# Patient Record
Sex: Female | Born: 1990 | Race: Black or African American | Hispanic: No | Marital: Single | Smoking: Never smoker
Health system: Southern US, Community
[De-identification: ages and names within clinical notes are randomized; demographics above are authoritative.]

## PROBLEM LIST (undated history)

## (undated) HISTORY — PX: NECK SURGERY: SHX720

---
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
## 2016-06-04 ENCOUNTER — Other Ambulatory Visit: Payer: Self-pay | Admitting: Physician Assistant

## 2016-06-04 DIAGNOSIS — R1030 Lower abdominal pain, unspecified: Secondary | ICD-10-CM

## 2016-06-04 DIAGNOSIS — K5900 Constipation, unspecified: Secondary | ICD-10-CM

## 2016-06-04 DIAGNOSIS — N941 Unspecified dyspareunia: Secondary | ICD-10-CM

## 2016-06-07 ENCOUNTER — Other Ambulatory Visit: Payer: Self-pay

## 2016-06-14 ENCOUNTER — Other Ambulatory Visit: Payer: Self-pay

## 2017-09-07 ENCOUNTER — Ambulatory Visit (HOSPITAL_COMMUNITY)
Admission: EM | Admit: 2017-09-07 | Discharge: 2017-09-07 | Disposition: A | Discharge status: Home / Self Care | Payer: No Typology Code available for payment source | Attending: Family Medicine | Admitting: Family Medicine

## 2017-09-07 ENCOUNTER — Encounter (HOSPITAL_COMMUNITY): Payer: Self-pay | Admitting: Emergency Medicine

## 2017-09-07 DIAGNOSIS — J029 Acute pharyngitis, unspecified: Secondary | ICD-10-CM | POA: Diagnosis present

## 2017-09-07 DIAGNOSIS — Z885 Allergy status to narcotic agent status: Secondary | ICD-10-CM | POA: Insufficient documentation

## 2017-09-07 DIAGNOSIS — R1310 Dysphagia, unspecified: Secondary | ICD-10-CM | POA: Diagnosis not present

## 2017-09-07 DIAGNOSIS — J039 Acute tonsillitis, unspecified: Secondary | ICD-10-CM | POA: Diagnosis not present

## 2017-09-07 DIAGNOSIS — Z79899 Other long term (current) drug therapy: Secondary | ICD-10-CM | POA: Diagnosis not present

## 2017-09-07 DIAGNOSIS — R590 Localized enlarged lymph nodes: Secondary | ICD-10-CM | POA: Diagnosis not present

## 2017-09-07 DIAGNOSIS — J358 Other chronic diseases of tonsils and adenoids: Secondary | ICD-10-CM

## 2017-09-07 LAB — POCT RAPID STREP A: Streptococcus, Group A Screen (Direct): NEGATIVE

## 2017-09-07 MED ORDER — METHYLPREDNISOLONE ACETATE 80 MG/ML IJ SUSP
INTRAMUSCULAR | Status: AC
  Filled 2017-09-07: qty 1

## 2017-09-07 MED ORDER — METHYLPREDNISOLONE ACETATE 80 MG/ML IJ SUSP
80.0000 mg | Freq: Once | INTRAMUSCULAR | Status: AC
  Administered 2017-09-07: 80 mg via INTRAMUSCULAR

## 2017-09-07 MED ORDER — AMOXICILLIN 400 MG/5ML PO SUSR: 800.0000 mg | Freq: Two times a day (BID) | ORAL | 0 refills | Status: DC

## 2017-09-07 NOTE — ED Triage Notes (Signed)
Pt c/o sore throat, fever, chills since friday

## 2017-09-07 NOTE — ED Provider Notes (Signed)
  MRN: [id number] DOB: 05-07-1990  Subjective:   Michele [name] is a 27 y.o. female presenting for 2 day history of sore throat, difficulty swallowing, body aches, headaches, chills, fever (101F), sinus congestion, bilateral ear discomfort, nausea without vomiting. Has tried Alleve, alka-seltzer with minimal relief. Took 3 Alleve at once felt upset stomach after that. Denies sinus pain, cough, chest pain, shob, wheezing, abdominal pain, rashes. Denies smoking cigarettes. Takes Allegra for her allergies. Also take Effexor, just started this ~3 weeks ago.  Reports a history of tonsillitis, last episode several years ago.  No current facility-administered medications for this encounter.   Current Outpatient Medications:  .  Fexofenadine HCl (ALLEGRA PO), Take by mouth., Disp: , Rfl:  .  venlafaxine (EFFEXOR) 75 MG tablet, Take 75 mg by mouth 2 (two) times daily., Disp: , Rfl:     Allergies  Allergen Reactions  . Codeine     PMH of allergies, depression.     Past Surgical History:  Procedure Laterality Date  . NECK SURGERY      Objective:   Vitals: BP 138/76   Pulse (!) 104   Temp 98.3 F (36.8 C)   Resp 18   LMP 08/07/2017   SpO2 100%   Physical Exam  Constitutional: She is oriented to person, place, and time. She appears well-developed and well-nourished.  HENT:  Right-sided tonsillar swelling, erythema with significant exudate.  Eyes: Right eye exhibits no discharge. Left eye exhibits no discharge. No scleral icterus.  Neck: Normal range of motion. Neck supple.  Cardiovascular: Normal rate, regular rhythm and intact distal pulses. Exam reveals no gallop and no friction rub.  No murmur heard. Pulmonary/Chest: Effort normal and breath sounds normal. No stridor. No respiratory distress. She has no wheezes. She has no rales.  Lymphadenopathy:    She has cervical adenopathy (right sided).  Neurological: She is alert and oriented to person, place, and time.  Skin: Skin is warm  and dry.  Psychiatric: She has a normal mood and affect.   Results for orders placed or performed during the hospital encounter of 09/07/17 (from the past 24 hour(s))  POCT rapid strep A [facility])     Status: None   Collection Time: 09/07/17  5:54 PM  Result Value Ref Range   Streptococcus, Group A Screen (Direct) NEGATIVE NEGATIVE    Assessment and Plan :   Pharyngitis, unspecified etiology  Tonsillar exudate  Tonsillitis  Cervical lymphadenopathy  Despite rapid strep test being negative will cover for pharyngitis given her physical exam findings.  Patient is to start amoxicillin twice a day with food.  Schedule Tylenol and ibuprofen for pain and inflammation.  Follow-up with PCP, consider referral to ENT for frequent throat infections, tonsillitis.   Jaynee Eagles, [place] 09/07/17 1857

## 2017-09-07 NOTE — Discharge Instructions (Signed)
You may take 500mg  Tylenol every 6 hours for pain and inflammation. Hydrate well with at least 2 liters (1 gallon) of water daily. Touch base with your PCP about a referral to ENT if you continue to have problems with throat infections.

## 2017-09-08 ENCOUNTER — Emergency Department (HOSPITAL_COMMUNITY): Payer: No Typology Code available for payment source

## 2017-09-08 ENCOUNTER — Encounter (HOSPITAL_COMMUNITY): Payer: Self-pay

## 2017-09-08 ENCOUNTER — Other Ambulatory Visit: Payer: Self-pay

## 2017-09-08 ENCOUNTER — Emergency Department (HOSPITAL_COMMUNITY)
Admission: EM | Admit: 2017-09-08 | Discharge: 2017-09-08 | Disposition: A | Discharge status: Home / Self Care | Payer: No Typology Code available for payment source | Attending: Emergency Medicine | Admitting: Emergency Medicine

## 2017-09-08 DIAGNOSIS — K2900 Acute gastritis without bleeding: Secondary | ICD-10-CM | POA: Insufficient documentation

## 2017-09-08 DIAGNOSIS — R0981 Nasal congestion: Secondary | ICD-10-CM | POA: Diagnosis not present

## 2017-09-08 DIAGNOSIS — J029 Acute pharyngitis, unspecified: Secondary | ICD-10-CM | POA: Diagnosis not present

## 2017-09-08 DIAGNOSIS — Z79899 Other long term (current) drug therapy: Secondary | ICD-10-CM | POA: Diagnosis not present

## 2017-09-08 DIAGNOSIS — R1013 Epigastric pain: Secondary | ICD-10-CM | POA: Diagnosis present

## 2017-09-08 LAB — CBC
HEMATOCRIT: 42.9 % (ref 36.0–46.0)
Hemoglobin: 14 g/dL (ref 12.0–15.0)
MCH: 28.3 pg (ref 26.0–34.0)
MCHC: 32.6 g/dL (ref 30.0–36.0)
MCV: 86.7 fL (ref 78.0–100.0)
Platelets: 224 10*3/uL (ref 150–400)
RBC: 4.95 MIL/uL (ref 3.87–5.11)
RDW: 12.7 % (ref 11.5–15.5)
WBC: 8 10*3/uL (ref 4.0–10.5)

## 2017-09-08 LAB — COMPREHENSIVE METABOLIC PANEL
ALK PHOS: 53 U/L (ref 38–126)
ALT: 25 U/L (ref 14–54)
ANION GAP: 9 (ref 5–15)
AST: 23 U/L (ref 15–41)
Albumin: 4.3 g/dL (ref 3.5–5.0)
BUN: <5 mg/dL — ABNORMAL LOW (ref 6–20)
CALCIUM: 10.1 mg/dL (ref 8.9–10.3)
CHLORIDE: 103 mmol/L (ref 101–111)
CO2: 26 mmol/L (ref 22–32)
Creatinine, Ser: 0.68 mg/dL (ref 0.44–1.00)
GFR calc non Af Amer: >60 mL/min (ref >60)
Glucose, Bld: 95 mg/dL (ref 65–99)
POTASSIUM: 4.6 mmol/L (ref 3.5–5.1)
SODIUM: 138 mmol/L (ref 135–145)
Total Bilirubin: 0.4 mg/dL (ref 0.3–1.2)
Total Protein: 8.3 g/dL — ABNORMAL HIGH (ref 6.5–8.1)

## 2017-09-08 LAB — URINALYSIS, ROUTINE W REFLEX MICROSCOPIC
Bilirubin Urine: NEGATIVE
Glucose, UA: NEGATIVE mg/dL
HGB URINE DIPSTICK: NEGATIVE
Ketones, ur: NEGATIVE mg/dL
Leukocytes, UA: NEGATIVE
Nitrite: NEGATIVE
Protein, ur: NEGATIVE mg/dL
SPECIFIC GRAVITY, URINE: 1.01 (ref 1.005–1.030)
pH: 6 (ref 5.0–8.0)

## 2017-09-08 LAB — I-STAT BETA HCG BLOOD, ED (MC, WL, AP ONLY)

## 2017-09-08 LAB — LIPASE, BLOOD: LIPASE: 26 U/L (ref 11–51)

## 2017-09-08 MED ORDER — IOHEXOL 300 MG/ML  SOLN
100.0000 mL | Freq: Once | INTRAMUSCULAR | Status: AC | PRN
  Administered 2017-09-08: 100 mL via INTRAVENOUS

## 2017-09-08 MED ORDER — RANITIDINE HCL 150 MG PO CAPS: 150.0000 mg | ORAL_CAPSULE | Freq: Two times a day (BID) | ORAL | 0 refills | Status: DC

## 2017-09-08 NOTE — ED Provider Notes (Signed)
[facility] EMERGENCY DEPARTMENT Provider Note   CSN: [id number] Arrival date & time: 09/08/17  1127   History   Chief Complaint Chief Complaint  Patient presents with  . Abdominal Pain    HPI Michele [name] is a 27 y.o. female.  The history is provided by the patient.   27 yo F with no significant PMHx who presents with epigastric abd pain x 1 day. Initially intermittent, now constant. Described as sharp, waxing and waning, severe, nonradiating. Worsened with cough and with GI cocktail given at urgent care prior to arrival. Not relieved by anything. Has had recent URI symptoms, sore throat x 4 days, has been started on amoxicillin. States abd pain occurred shortly after taking alka-seltzer cold and flu as well as ibuprofen. Has IUD. No similar symptoms in the past. No heavy EtOH use. Denies N/V/D.    History reviewed. No pertinent past medical history.  There are no active problems to display for this patient.   Past Surgical History:  Procedure Laterality Date  . NECK SURGERY       OB History   None      Home Medications    Prior to Admission medications   Medication Sig Start Date End Date Taking? Authorizing Provider  amoxicillin (AMOXIL) 400 MG/5ML suspension Take 10 mLs (800 mg total) by mouth 2 (two) times daily. 09/07/17   Jaynee Eagles, PA-C  Fexofenadine HCl (ALLEGRA PO) Take by mouth.    [provider]  ranitidine (ZANTAC) 150 MG capsule Take 1 capsule (150 mg total) by mouth 2 (two) times daily for 7 days. 09/08/17 09/15/17  Norm Salt, MD  venlafaxine (EFFEXOR) 75 MG tablet Take 75 mg by mouth 2 (two) times daily.    [provider]    Family History Family History  Problem Relation Age of Onset  . Hypertension Mother   . Hypertension Father     Social History Social History   Tobacco Use  . Smoking status: Never Smoker  Substance Use Topics  . Alcohol use: Never    Frequency: Never  . Drug use: Not on file      Allergies   Codeine   Review of Systems Review of Systems  Constitutional: Negative for chills and fever.  HENT: Positive for congestion and sore throat. Negative for ear pain.   Eyes: Negative for pain and visual disturbance.  Respiratory: Negative for cough and shortness of breath.   Cardiovascular: Negative for chest pain and palpitations.  Gastrointestinal: Positive for abdominal pain. Negative for diarrhea, nausea and vomiting.  Genitourinary: Negative for dysuria and hematuria.  Musculoskeletal: Negative for arthralgias and back pain.  Skin: Negative for color change and rash.  Neurological: Negative for seizures and syncope.  All other systems reviewed and are negative.    Physical Exam Updated Vital Signs BP 135/86   Pulse 73   Temp 98.2 F (36.8 C) (Oral)   Resp 18   Ht 5\' 1"  (1.549 m)   Wt 72.6 kg (160 lb)   LMP 08/20/2017   SpO2 99%   BMI 30.23 kg/m   Physical Exam  Constitutional: She appears well-developed and well-nourished. No distress.  HENT:  Head: Normocephalic and atraumatic.  Mouth/Throat: Oropharynx is clear and moist.  Eyes: Conjunctivae are normal. No scleral icterus.  Neck: Neck supple.  Cardiovascular: Normal rate and regular rhythm.  No murmur heard. Pulmonary/Chest: Effort normal and breath sounds normal. No respiratory distress.  Abdominal: Soft. There is tenderness in the epigastric area.  There is negative Murphy's sign.  Musculoskeletal: She exhibits no edema.  Neurological: She is alert.  Skin: Skin is warm and dry.  Psychiatric: She has a normal mood and affect.  Nursing note and vitals reviewed.    ED Treatments / Results  Labs (all labs ordered are listed, but only abnormal results are displayed) Labs Reviewed  COMPREHENSIVE METABOLIC PANEL - Abnormal; Notable for the following components:      Result Value   BUN <5 (*)    Total Protein 8.3 (*)    All other components within normal limits  LIPASE, BLOOD  CBC   URINALYSIS, ROUTINE W REFLEX MICROSCOPIC  I-STAT BETA HCG BLOOD, ED ([facility], [facility], [facility] ONLY)    EKG EKG Interpretation  Date/Time:  Monday September 08 2017 17:53:55 EDT Ventricular Rate:  77 PR Interval:    QRS Duration: 64 QT Interval:  359 QTC Calculation: 407 R Axis:   79 Text Interpretation:  Sinus rhythm Abnormal Q suggests anterior infarct No previous ECGs available Confirmed by Theotis Burrow [id number]) on 09/08/2017 6:41:25 PM   Radiology Ct Abdomen Pelvis W Contrast  Result Date: 09/08/2017 CLINICAL DATA:  Epigastric pain.  Worsening pain since 2 days prior EXAM: CT ABDOMEN AND PELVIS WITH CONTRAST TECHNIQUE: Multidetector CT imaging of the abdomen and pelvis was performed using the standard protocol following bolus administration of intravenous contrast. CONTRAST:  126mL OMNIPAQUE IOHEXOL 300 MG/ML  SOLN COMPARISON:  None. FINDINGS: Lower chest: Lung bases are clear. Hepatobiliary: No focal hepatic lesion. No biliary duct dilatation. Gallbladder is normal. Common bile duct is normal. Pancreas: Pancreas is normal. No ductal dilatation. No pancreatic inflammation. Spleen: Normal spleen Adrenals/urinary tract: Adrenal glands and kidneys are normal. The ureters and bladder normal. Stomach/Bowel: Distal esophagus is normal. The proximal stomach is normal. There is submucosal edema within the gastric antrum lesion pylorus with wall thickening to 13 mm (image 24/2. Duodenum is normal. Small-bowel normal. Appendix normal ascending and transverse colon are normal. The descending colon sigmoid colon normal. Rectum normal Vascular/Lymphatic: Abdominal aorta is normal caliber. No periportal or retroperitoneal adenopathy. No pelvic adenopathy. Reproductive: IUD in expected location.  Uterus and ovaries normal Other: No free fluid. Musculoskeletal: No aggressive osseous lesion. IMPRESSION: 1. Sub mucosal edema in the gastric antrum and pylorus suggests gastritis. No evidence of gastric outlet obstruction. 2. Normal  pancreas and gallbladder. 3. IUD in expected location. Electronically Signed   By: Suzy Bouchard M.D.   On: 09/08/2017 17:54    Procedures Procedures (including critical care time)  Medications Ordered in ED Medications  iohexol (OMNIPAQUE) 300 MG/ML solution 100 mL (100 mLs Intravenous Contrast Given 09/08/17 1734)     Initial Impression / Assessment and Plan / ED Course  I have reviewed the triage vital signs and the nursing notes.  Pertinent labs & imaging results that were available during my care of the patient were reviewed by me and considered in my medical decision making (see chart for details).     Michele [name] is a 27 y.o. female with no significant PMhx who p/w epigastric abd pain in setting or sore throat, URI symptoms. Reviewed and confirmed nursing documentation for past medical history, family history, social history. VS afebrile, wnl. Exam remarkable for epigastric tenderness. Ddx includes PUD/ gastritis related to pharyngitis illness, though did not improve with GI cocktail. Considering duodenitis, ulcer, pancreatitis though less likely.  .  Beta hgc neg. Lipase wnl. CMP unremarkable. CBC unremarkable. UA wnl. CT abd/pelvis with findings c/w gastritis. EKG at  17:53 with NSR, nl axis, nl intervals, no STE/D.   Old records reviewed. Labs reviewed by me and used in the medical decision making.  Imaging viewed and interpreted by me and used in the medical decision making (formal interpretation from radiologist). EKG reviewed by me and used in the medical decision making. D/c home in stable condition with zantac, return precautions discussed. Patient agreeable with plan for d/c home.   Final Clinical Impressions(s) / ED Diagnoses   Final diagnoses:  Acute gastritis without hemorrhage, unspecified gastritis type    ED Discharge Orders        Ordered    ranitidine (ZANTAC) 150 MG capsule  2 times daily     09/08/17 1844       Norm Salt, MD 09/08/17 1847      Rex Kras, Wenda Overland, MD 09/10/17 1454

## 2017-09-08 NOTE — ED Triage Notes (Addendum)
Pt presents for evaluation of ongoing sore throat with blisters to throat, multiple negative strep tests. Pt was given solumedrol injection yesterday and PO amoxicillin. Pt was seen by PCP today for abd pain as well. Denies N/V/D.

## 2017-09-09 LAB — CULTURE, GROUP A STREP (THRC)

## 2018-04-24 ENCOUNTER — Ambulatory Visit (INDEPENDENT_AMBULATORY_CARE_PROVIDER_SITE_OTHER): Payer: Medicaid Other | Admitting: Family Medicine

## 2018-04-24 ENCOUNTER — Other Ambulatory Visit (HOSPITAL_COMMUNITY)
Admission: RE | Admit: 2018-04-24 | Discharge: 2018-04-24 | Disposition: A | Discharge status: Home / Self Care | Payer: Medicaid Other | Source: Ambulatory Visit | Attending: Family Medicine | Admitting: Family Medicine

## 2018-04-24 ENCOUNTER — Encounter: Payer: Self-pay | Admitting: Family Medicine

## 2018-04-24 VITALS — BP 116/70 | HR 65 | Temp 97.8°F | Resp 16 | Ht 62.0 in | Wt 165.6 lb

## 2018-04-24 DIAGNOSIS — Z124 Encounter for screening for malignant neoplasm of cervix: Secondary | ICD-10-CM | POA: Insufficient documentation

## 2018-04-24 DIAGNOSIS — M5416 Radiculopathy, lumbar region: Secondary | ICD-10-CM | POA: Diagnosis not present

## 2018-04-24 DIAGNOSIS — Z13220 Encounter for screening for lipoid disorders: Secondary | ICD-10-CM

## 2018-04-24 DIAGNOSIS — R413 Other amnesia: Secondary | ICD-10-CM

## 2018-04-24 DIAGNOSIS — N941 Unspecified dyspareunia: Secondary | ICD-10-CM

## 2018-04-24 DIAGNOSIS — R102 Pelvic and perineal pain: Secondary | ICD-10-CM | POA: Diagnosis not present

## 2018-04-24 DIAGNOSIS — Z23 Encounter for immunization: Secondary | ICD-10-CM

## 2018-04-24 DIAGNOSIS — Z131 Encounter for screening for diabetes mellitus: Secondary | ICD-10-CM

## 2018-04-24 DIAGNOSIS — Z113 Encounter for screening for infections with a predominantly sexual mode of transmission: Secondary | ICD-10-CM | POA: Diagnosis present

## 2018-04-24 DIAGNOSIS — Z6830 Body mass index (BMI) 30.0-30.9, adult: Secondary | ICD-10-CM

## 2018-04-24 DIAGNOSIS — Z1159 Encounter for screening for other viral diseases: Secondary | ICD-10-CM

## 2018-04-24 DIAGNOSIS — E6609 Other obesity due to excess calories: Secondary | ICD-10-CM

## 2018-04-24 MED ORDER — PREDNISONE 5 MG (48) PO TBPK: ORAL_TABLET | ORAL | 0 refills | Status: DC
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
## 2018-04-24 NOTE — Patient Instructions (Addendum)
[email or web address] therapist finder. Please try to find out what type of dementia   Here are some resources to help you if you feel you are in a mental health crisis:  [place] - Call [phone]  for help - Website with more resources: GripTrip.com.pt  [facility] - Call [phone] for help. - Mobile Crisis Program available 24 hours a day, 365 days a year. - Available for anyone of any age in [place] counties.  [facility] - Address: [address], [place] - Telephone: [phone]  - Hours of Operation: Sunday - Saturday - 8:00 a.m. - 8:00 p.m. - Medicaid, Medicare (Government Issued Only), BCBS, and [place] [facility], [place], Psychiatrists on-site to provide medication management, [place], and Peer Support Care.  [facility] - Call [phone] for help. - Mobile Crisis Program available 24 hours a day, 365 days a year. - Available for anyone of any age in [place]

## 2018-04-26 LAB — RPR: RPR: NONREACTIVE

## 2018-04-26 LAB — CBC WITH DIFFERENTIAL/PLATELET
ABSOLUTE MONOCYTES: 458 {cells}/uL (ref 200–950)
BASOS PCT: 0.7 %
Basophils Absolute: 29 cells/uL (ref 0–200)
EOS ABS: 218 {cells}/uL (ref 15–500)
Eosinophils Relative: 5.2 %
HEMATOCRIT: 42.6 % (ref 35.0–45.0)
HEMOGLOBIN: 13.9 g/dL (ref 11.7–15.5)
Lymphs Abs: 1869 cells/uL (ref 850–3900)
MCH: 27.7 pg (ref 27.0–33.0)
MCHC: 32.6 g/dL (ref 32.0–36.0)
MCV: 85 fL (ref 80.0–100.0)
MPV: 10.3 fL (ref 7.5–12.5)
Monocytes Relative: 10.9 %
NEUTROS ABS: 1625 {cells}/uL (ref 1500–7800)
Neutrophils Relative %: 38.7 %
Platelets: 280 10*3/uL (ref 140–400)
RBC: 5.01 10*6/uL (ref 3.80–5.10)
RDW: 12.8 % (ref 11.0–15.0)
Total Lymphocyte: 44.5 %
WBC: 4.2 10*3/uL (ref 3.8–10.8)

## 2018-04-26 LAB — COMPLETE METABOLIC PANEL WITH GFR
AG RATIO: 1.3 (calc) (ref 1.0–2.5)
ALBUMIN MSPROF: 4.4 g/dL (ref 3.6–5.1)
ALT: 16 U/L (ref 6–29)
AST: 14 U/L (ref 10–30)
Alkaline phosphatase (APISO): 55 U/L (ref 33–115)
BILIRUBIN TOTAL: 0.3 mg/dL (ref 0.2–1.2)
BUN: 13 mg/dL (ref 7–25)
CHLORIDE: 102 mmol/L (ref 98–110)
CO2: 28 mmol/L (ref 20–32)
Calcium: 9.9 mg/dL (ref 8.6–10.2)
Creat: 0.73 mg/dL (ref 0.50–1.10)
GFR, EST AFRICAN AMERICAN: 132 mL/min/{1.73_m2} (ref >=60)
GFR, Est Non African American: 114 mL/min/{1.73_m2} (ref >=60)
GLOBULIN: 3.4 g/dL (ref 1.9–3.7)
Glucose, Bld: 59 mg/dL — ABNORMAL LOW (ref 65–139)
POTASSIUM: 4.1 mmol/L (ref 3.5–5.3)
SODIUM: 138 mmol/L (ref 135–146)
TOTAL PROTEIN: 7.8 g/dL (ref 6.1–8.1)

## 2018-04-26 LAB — LIPID PANEL
CHOLESTEROL: 172 mg/dL (ref <200)
HDL: 33 mg/dL — AB (ref >50)
LDL Cholesterol (Calc): 112 mg/dL (calc) — ABNORMAL HIGH
Non-HDL Cholesterol (Calc): 139 mg/dL (calc) — ABNORMAL HIGH (ref <130)
Total CHOL/HDL Ratio: 5.2 (calc) — ABNORMAL HIGH (ref <5.0)
Triglycerides: 152 mg/dL — ABNORMAL HIGH (ref <150)

## 2018-04-26 LAB — HEPATITIS C ANTIBODY
Hepatitis C Ab: NONREACTIVE
SIGNAL TO CUT-OFF: 0.02 (ref <1.00)

## 2018-04-26 LAB — TSH: TSH: 0.27 mIU/L — ABNORMAL LOW

## 2018-04-26 LAB — HIV ANTIBODY (ROUTINE TESTING W REFLEX): HIV 1&2 Ab, 4th Generation: NONREACTIVE

## 2018-04-27 ENCOUNTER — Telehealth: Payer: Self-pay | Admitting: Emergency Medicine

## 2018-04-27 ENCOUNTER — Other Ambulatory Visit: Payer: Self-pay | Admitting: Family Medicine

## 2018-04-27 DIAGNOSIS — R7989 Other specified abnormal findings of blood chemistry: Secondary | ICD-10-CM

## 2018-04-27 LAB — CERVICOVAGINAL ANCILLARY ONLY
BACTERIAL VAGINITIS: NEGATIVE
CANDIDA VAGINITIS: NEGATIVE
CHLAMYDIA, DNA PROBE: NEGATIVE
Neisseria Gonorrhea: NEGATIVE
TRICH (WINDOWPATH): NEGATIVE

## 2018-04-27 NOTE — Telephone Encounter (Signed)
How long will it take for the Prednisone to start working for her back. Still having back pain that radiates to her legs

## 2018-04-27 NOTE — Telephone Encounter (Signed)
She should give it at least 7 days to start really helping.

## 2018-04-28 LAB — CYTOLOGY - PAP
ADEQUACY: ABSENT
Bacterial vaginitis: POSITIVE — AB
CANDIDA VAGINITIS: NEGATIVE
CHLAMYDIA, DNA PROBE: NEGATIVE
DIAGNOSIS: NEGATIVE
Neisseria Gonorrhea: NEGATIVE
Trichomonas: NEGATIVE

## 2018-04-29 NOTE — Telephone Encounter (Signed)
Left message for patient

## 2018-04-30 ENCOUNTER — Telehealth: Payer: Self-pay | Admitting: *Deleted

## 2018-04-30 DIAGNOSIS — M545 Low back pain, unspecified: Secondary | ICD-10-CM

## 2018-04-30 DIAGNOSIS — M79605 Pain in left leg: Secondary | ICD-10-CM

## 2018-04-30 DIAGNOSIS — M79604 Pain in right leg: Secondary | ICD-10-CM

## 2018-04-30 NOTE — Telephone Encounter (Signed)
Patient has access to MyChart and she is able to see her results- she is concerned about her glucose levels being so low. Patient states she was not fasting when she was in the office- she actually had just eaten and was drinking a Chick fila lemonade. Patient was not having symptoms and has never had any problems in the past. Does she needs to recheck that? She is aware that she needs to repeat her TSH- and has marked her calender for that.  Patient states she is not getting the results she thought she would with the Prednisone- she is still having pain in her leg. She wants to know what the next step is. Told patient I would send her message and get answer for her.

## 2018-05-01 NOTE — Telephone Encounter (Signed)
Still having leg pains. Hurting her to walk. Please advise.

## 2018-05-01 NOTE — Telephone Encounter (Signed)
Never had low BS before. Had just ate before appointment. Had migraines, jittery and headaches at time. Please advise

## 2018-05-04 ENCOUNTER — Ambulatory Visit: Admission: RE | Admit: 2018-05-04 | Payer: Medicaid Other | Source: Ambulatory Visit

## 2018-05-04 NOTE — Telephone Encounter (Addendum)
Blood sugar is likely going low due to over release of insulin after eating large meal/meal high in sugar.  It sounds like this may be an ongoing issue based on her report of hx of feeling jittery and headaches.  Though she is NOT diabetic, she needs to try eating a low glycemic diet like a diabetic for 2-4 weeks and report back about her symptoms.  Avoiding food high in sugar/carbohydrates.  Eating whole grains/wheat foods, no processed sugars, no sweet beverages, and make sure to eat 3 meals and 2-3 healthy snacks each day. Drink plenty of water.   I will refer to ortho for low back and leg pain.

## 2018-05-04 NOTE — Addendum Note (Signed)
Addended by: Hubbard Hartshorn on: 05/04/2018 09:04 AM   Modules accepted: Orders

## 2018-05-04 NOTE — Telephone Encounter (Signed)
Patient notified

## 2018-05-06 ENCOUNTER — Telehealth: Payer: Self-pay | Admitting: Emergency Medicine

## 2018-05-06 NOTE — Telephone Encounter (Signed)
Copied from [place] [id number]. Topic: General - Other >> May 04, 2018  4:13 PM Windy Kalata wrote: Reason for CRM: Denton Ar from [facility] called as a courtesy that patient did not show for her ultrasound  Best call back is [phone]

## 2018-05-08 ENCOUNTER — Ambulatory Visit
Admission: RE | Admit: 2018-05-08 | Discharge: 2018-05-08 | Disposition: A | Discharge status: Home / Self Care | Payer: Medicaid Other | Source: Ambulatory Visit | Attending: Family Medicine | Admitting: Family Medicine

## 2018-05-08 DIAGNOSIS — N941 Unspecified dyspareunia: Secondary | ICD-10-CM | POA: Diagnosis present

## 2018-05-08 DIAGNOSIS — R102 Pelvic and perineal pain: Secondary | ICD-10-CM | POA: Insufficient documentation

## 2018-05-11 ENCOUNTER — Encounter: Payer: Self-pay | Admitting: Family Medicine

## 2018-05-12 ENCOUNTER — Encounter: Payer: Medicaid Other | Admitting: Family Medicine

## 2018-05-19 ENCOUNTER — Encounter: Payer: Self-pay | Admitting: Family Medicine

## 2018-05-19 DIAGNOSIS — M5416 Radiculopathy, lumbar region: Secondary | ICD-10-CM

## 2018-06-08 ENCOUNTER — Telehealth: Payer: Self-pay | Admitting: Family Medicine

## 2018-06-08 ENCOUNTER — Other Ambulatory Visit: Payer: Self-pay

## 2018-06-08 DIAGNOSIS — R7989 Other specified abnormal findings of blood chemistry: Secondary | ICD-10-CM

## 2018-06-08 NOTE — Telephone Encounter (Signed)
-----   Message from Hubbard Hartshorn, FNP sent at 04/27/2018  7:45 AM EST ----- Regarding: Repeat TSH Please call patient to come in for repeat thyroid testing.

## 2018-06-08 NOTE — Telephone Encounter (Signed)
Voicemail box is full. If patient calls back please inform patient to come back in for repeat thyroid testing.

## 2018-07-23 ENCOUNTER — Ambulatory Visit: Payer: Medicaid Other | Admitting: Family Medicine

## 2018-09-25 ENCOUNTER — Encounter: Payer: Medicaid Other | Admitting: Family Medicine

## 2018-10-15 ENCOUNTER — Encounter: Payer: Medicaid Other | Admitting: Family Medicine

## 2018-10-27 ENCOUNTER — Other Ambulatory Visit: Payer: Self-pay

## 2018-10-27 ENCOUNTER — Other Ambulatory Visit (HOSPITAL_COMMUNITY)
Admission: RE | Admit: 2018-10-27 | Discharge: 2018-10-27 | Disposition: A | Discharge status: Home / Self Care | Payer: Medicaid Other | Source: Ambulatory Visit | Attending: Nurse Practitioner | Admitting: Nurse Practitioner

## 2018-10-27 ENCOUNTER — Ambulatory Visit: Payer: Medicaid Other | Admitting: Nurse Practitioner

## 2018-10-27 ENCOUNTER — Encounter: Payer: Self-pay | Admitting: Nurse Practitioner

## 2018-10-27 VITALS — BP 122/78 | HR 85 | Temp 97.3°F | Resp 14 | Ht 62.0 in | Wt 177.7 lb

## 2018-10-27 DIAGNOSIS — R350 Frequency of micturition: Secondary | ICD-10-CM | POA: Diagnosis not present

## 2018-10-27 DIAGNOSIS — N898 Other specified noninflammatory disorders of vagina: Secondary | ICD-10-CM | POA: Diagnosis not present

## 2018-10-27 DIAGNOSIS — R102 Pelvic and perineal pain: Secondary | ICD-10-CM | POA: Insufficient documentation

## 2018-10-27 LAB — POCT URINALYSIS DIPSTICK
Bilirubin, UA: NEGATIVE
Blood, UA: NEGATIVE
Glucose, UA: NEGATIVE
Ketones, UA: NEGATIVE
Leukocytes, UA: NEGATIVE
Nitrite, UA: NEGATIVE
Protein, UA: NEGATIVE
Spec Grav, UA: 1.015 (ref 1.010–1.025)
Urobilinogen, UA: 0.2 E.U./dL
pH, UA: 5.5 (ref 5.0–8.0)

## 2018-10-27 NOTE — Progress Notes (Signed)
Name: Michele [name]   MRN: [id number]    DOB: 01-29-1991   Date:10/27/2018       Progress Note  Subjective  Chief Complaint  Chief Complaint  Patient presents with  . Follow-up    Internal vaginal bump    HPI  Patient presents for vaginal pain, states this has been ongoing for 2 years. Was seen for this in January- vaginal exam was normal except for tenderness of the right ovary. Pelvic/transvaginal ultrasound completed in 05/08/18 was normal- showing no acute findings- Right ovary Measurements: 3.0 x 1.7 x 2.2 cm = volume: 5.7 mL. Normal appearance/no adnexal mass. IUD noted in expected position- was placed in 2016.  States area is painful and feels like there is a golfball sized area inside vagina, states had sex 2 weeks ago and hit that area and it was very tender and has been painful since then. Has not taken anything for pain, unable to ride bike due to discomfort.  Does endorses recent urinary frequency and states she is peeing small amounts. Denies dysuria or lower back pain, endorses suprapubic pressure.   PHQ2/9: Depression screen [facility] 2/9 10/27/2018 04/24/2018  Decreased Interest 0 0  Down, Depressed, Hopeless 0 0  PHQ - 2 Score 0 0  Altered sleeping 0 0  Tired, decreased energy 0 0  Change in appetite 0 1  Feeling bad or failure about yourself  0 -  Trouble concentrating 0 3  Moving slowly or fidgety/restless 0 0  Suicidal thoughts 0 0  PHQ-9 Score 0 4  Difficult doing work/chores Not difficult at all Somewhat difficult     PHQ reviewed. Negative  Patient Active Problem List   Diagnosis Date Noted  . Memory changes 04/24/2018  . Left lumbar radiculitis 04/24/2018  . Class 1 obesity due to excess calories without serious comorbidity with body mass index (BMI) of 30.0 to 30.9 in adult 04/24/2018    History reviewed. No pertinent past medical history.  Past Surgical History:  Procedure Laterality Date  . NECK SURGERY      Social History   Tobacco Use  .  Smoking status: Never Smoker  . Smokeless tobacco: Never Used  Substance Use Topics  . Alcohol use: Never    Frequency: Never     Current Outpatient Medications:  .  Fexofenadine HCl (ALLEGRA PO), Take by mouth., Disp: , Rfl:  .  predniSONE (STERAPRED UNI-PAK 48 TAB) 5 MG (48) TBPK tablet, Take as directed. (Patient not taking: Reported on 10/27/2018), Disp: 48 tablet, Rfl: 0 .  ranitidine (ZANTAC) 150 MG capsule, Take 1 capsule (150 mg total) by mouth 2 (two) times daily for 7 days., Disp: 14 capsule, Rfl: 0 .  venlafaxine (EFFEXOR) 75 MG tablet, Take 75 mg by mouth 2 (two) times daily., Disp: , Rfl:   Allergies  Allergen Reactions  . Codeine     ROS   No other specific complaints in a complete review of systems (except as listed in HPI above).  Objective  Vitals:   10/27/18 1413  BP: 122/78  Pulse: 85  Resp: 14  Temp: (!) 97.3 F (36.3 C)  TempSrc: Temporal  SpO2: 97%  Weight: 177 lb 11.2 oz (80.6 kg)  Height: 5\' 2"  (1.575 m)     Body mass index is 32.5 kg/m.  Nursing Note and Vital Signs reviewed.  Physical Exam Exam conducted with a chaperone present.  Constitutional:      Appearance: She is well-developed. She is not diaphoretic.  HENT:  Head: Normocephalic and atraumatic.  Cardiovascular:     Rate and Rhythm: Normal rate and regular rhythm.     Heart sounds: Normal heart sounds.  Pulmonary:     Effort: Pulmonary effort is normal.     Breath sounds: Normal breath sounds.  Genitourinary:    General: Normal vulva.     Pubic Area: No rash.      Labia:        Right: No rash or tenderness.        Left: No rash or tenderness.      Vagina: Vaginal discharge (white thin discharge) and tenderness present.     Cervix: Discharge present. No erythema or cervical bleeding.     Comments: IUD strings visible  Skin:    General: Skin is warm and dry.     Findings: No erythema.  Neurological:     Mental Status: She is alert and oriented to person, place, and  time.     Coordination: Coordination normal.  Psychiatric:        Behavior: Behavior normal.        Thought Content: Thought content normal.        Judgment: Judgment normal.        No results found for this or any previous visit (from the past 48 hour(s)).  Assessment & Plan  1. Vaginal pain Ibuprofen, negative [place] from January, follow-up with gyn  - Ambulatory referral to Gynecology  2. Urinary frequency - POCT Urinalysis Dipstick  3. Vaginal discharge - Cervicovaginal ancillary only

## 2018-10-27 NOTE — Patient Instructions (Signed)
-   Take ibuprofen 400mg  every 8-12 hours with food for pain and to decrease inflammation If you have not heard anything from my staff in a month about referral from today, please contact us here to follow-up [phone] or through mychart.

## 2018-10-30 ENCOUNTER — Other Ambulatory Visit: Payer: Self-pay | Admitting: Family Medicine

## 2018-10-30 ENCOUNTER — Encounter: Payer: Self-pay | Admitting: Family Medicine

## 2018-10-30 DIAGNOSIS — B9689 Other specified bacterial agents as the cause of diseases classified elsewhere: Secondary | ICD-10-CM

## 2018-10-30 DIAGNOSIS — N760 Acute vaginitis: Secondary | ICD-10-CM

## 2018-10-30 LAB — CERVICOVAGINAL ANCILLARY ONLY
Bacterial vaginitis: POSITIVE — AB
Candida vaginitis: NEGATIVE
Chlamydia: NEGATIVE
Neisseria Gonorrhea: NEGATIVE
Trichomonas: NEGATIVE

## 2018-10-30 MED ORDER — METRONIDAZOLE 500 MG PO TABS: 500.0000 mg | ORAL_TABLET | Freq: Two times a day (BID) | ORAL | 0 refills | Status: AC

## 2018-11-06 ENCOUNTER — Other Ambulatory Visit: Payer: Self-pay

## 2018-11-06 ENCOUNTER — Ambulatory Visit (INDEPENDENT_AMBULATORY_CARE_PROVIDER_SITE_OTHER): Payer: Medicaid Other | Admitting: Obstetrics and Gynecology

## 2018-11-06 ENCOUNTER — Encounter: Payer: Self-pay | Admitting: Obstetrics and Gynecology

## 2018-11-06 VITALS — BP 129/82 | HR 81 | Ht 61.0 in | Wt 176.5 lb

## 2018-11-06 DIAGNOSIS — N898 Other specified noninflammatory disorders of vagina: Secondary | ICD-10-CM | POA: Diagnosis not present

## 2018-11-06 DIAGNOSIS — N941 Unspecified dyspareunia: Secondary | ICD-10-CM

## 2018-11-06 NOTE — Progress Notes (Signed)
HPI:      [name] is a 28 y.o. No obstetric history on file. who LMP was No LMP recorded. (Menstrual status: IUD).  Subjective:   She presents today complains of a bump inside her vagina that comes and goes.  It is occasionally tender with intercourse and patient will now it is there and then at another time there is some tenderness but no bump.  She has recently been tested for all STDs and was found to have BV and is currently taking Flagyl. She was recently examined by her family physician and no abnormalities could be found. Of significant note patient has an IUD for birth control but has monthly periods.  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    Hx: The following portions of the patient's history were reviewed and updated as appropriate:             She  has no past medical history on file. She does not have any pertinent problems on file. She  has a past surgical history that includes Neck surgery. Her family history includes Hypertension in her father and mother. She  reports that she has never smoked. She has never used smokeless tobacco. She reports that she does not drink alcohol or use drugs. She has a current medication list which includes the following prescription(s): fexofenadine hcl, metronidazole, and venlafaxine. She is allergic to codeine.       Review of Systems:  Review of Systems  Constitutional: Denied constitutional symptoms, night sweats, recent illness, fatigue, fever, insomnia and weight loss.  Eyes: Denied eye symptoms, eye pain, photophobia, vision change and visual disturbance.  Ears/Nose/Throat/Neck: Denied ear, nose, throat or neck symptoms, hearing loss, nasal discharge, sinus congestion and sore throat.  Cardiovascular: Denied cardiovascular symptoms, arrhythmia, chest pain/pressure, edema, exercise intolerance, orthopnea and palpitations.  Respiratory: Denied pulmonary symptoms, asthma, pleuritic pain, productive sputum, cough, dyspnea and wheezing.   Gastrointestinal: Denied, gastro-esophageal reflux, melena, nausea and vomiting.  Genitourinary: See HPI for additional information.  Musculoskeletal: Denied musculoskeletal symptoms, stiffness, swelling, muscle weakness and myalgia.  Dermatologic: Denied dermatology symptoms, rash and scar.  Neurologic: Denied neurology symptoms, dizziness, headache, neck pain and syncope.  Psychiatric: Denied psychiatric symptoms, anxiety and depression.  Endocrine: Denied endocrine symptoms including hot flashes and night sweats.   Meds:   Current Outpatient Medications on File Prior to Visit  Medication Sig Dispense Refill  . Fexofenadine HCl (ALLEGRA PO) Take by mouth.    . metroNIDAZOLE (FLAGYL) 500 MG tablet Take 1 tablet (500 mg total) by mouth 2 (two) times daily for 7 days. 14 tablet 0  . venlafaxine (EFFEXOR) 75 MG tablet Take 75 mg by mouth 2 (two) times daily.     No current facility-administered medications on file prior to visit.     Objective:     Vitals:   11/06/18 1014  BP: 129/82  Pulse: 81              Physical examination   Pelvic:   Vulva: Normal appearance.  No lesions.  Vagina: No lesions or abnormalities noted.  Support: Normal pelvic support.  Urethra No masses tenderness or scarring.  Meatus Normal size without lesions or prolapse.  Cervix: Normal appearance.  No lesions.  IUD strings noted at the cervical loss  Anus: Normal exam.  No lesions.  Perineum: Normal exam.  No lesions.        Bimanual   Uterus: Normal size.  Non-tender.  Mobile.  AV.  Adnexae: No  masses.  Non-tender to palpation.  Cul-de-sac: Negative for abnormality.   No abnormality could be found inside the vagina but the spot in question is the right vaginal wall approximately 2 to 3 cm inside and toward the anterior approximately 10 o'clock position.  Assessment:    No obstetric history on file. Patient Active Problem List   Diagnosis Date Noted  . Memory changes 04/24/2018  . Left lumbar  radiculitis 04/24/2018  . Class 1 obesity due to excess calories without serious comorbidity with body mass index (BMI) of 30.0 to 30.9 in adult 04/24/2018     1. Vaginal cyst   2. 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   Plan:            1.  I discussed Gartner's duct cyst in detail and the patient is reassured to know that this is not a significant medical problem.  I have asked her to present when it is present and I will look at it and be able to investigate it further.  I have explained to her that the only treatment is surgery and she believes that it is not that significant and will continue to live with it unless it becomes much more severe. Orders No orders of the defined types were placed in this encounter.   No orders of the defined types were placed in this encounter.     F/U  Return for Pt to contact us if symptoms worsen. I spent 32 minutes involved in the care of this patient of which greater than 50% was spent discussing IUD, types of IUD, bleeding with IUD, Gartner's duct remnant cyst, literature regarding remnant cyst, management of cysts.  All questions answered.  Finis Bud, M.D. 11/06/2018 12:29 PM

## 2018-11-06 NOTE — Progress Notes (Signed)
Patient comes in today as a new patient. Patient states that she has a knot inside that vagina that is painful.

## 2019-01-18 ENCOUNTER — Encounter: Payer: Self-pay | Admitting: Family Medicine

## 2019-01-18 ENCOUNTER — Other Ambulatory Visit: Payer: Self-pay

## 2019-01-18 ENCOUNTER — Ambulatory Visit: Payer: Medicaid Other | Admitting: Family Medicine

## 2019-01-18 VITALS — BP 116/72 | HR 78 | Temp 97.8°F | Resp 16 | Ht 62.0 in | Wt 181.9 lb

## 2019-01-18 DIAGNOSIS — Z01818 Encounter for other preprocedural examination: Secondary | ICD-10-CM

## 2019-01-18 DIAGNOSIS — I471 Supraventricular tachycardia: Secondary | ICD-10-CM | POA: Insufficient documentation

## 2019-01-18 NOTE — Addendum Note (Signed)
Addended by: Iyanna Drummer G on: 01/18/2019 11:18 AM   Modules accepted: Orders

## 2019-01-18 NOTE — Progress Notes (Signed)
Name: Michele [name]   MRN: [id number]    DOB: 05-26-1990   Date:01/18/2019       Progress Note  Subjective  Chief Complaint  Chief Complaint  Patient presents with  . Procedure    surgical clearance for Lipo 360, [place] Butt Lift    HPI  PT presents for surgical clearance; she is planning to have Liposuction 360 and BBL ([employer]) on November 10 in [place].  She has researched her plastic surgery site in detail, we discussed risks/benefits of have surgery in a destination location - she will be staying there for almost a week to recover.  She brings a list of required labs which we will check today. Her EKG shows sinus rhythm, does have remote history of SVT at age 28.  She feels generally well today.  Her BMI is not quite in range - needs to be 33 or below.  Body mass index is 33.27 kg/m. She will follow up in 2 weeks for weigh-in and will work on losing a few more pounds.    Patient Active Problem List   Diagnosis Date Noted  . Memory changes 04/24/2018  . Left lumbar radiculitis 04/24/2018  . Class 1 obesity due to excess calories without serious comorbidity with body mass index (BMI) of 30.0 to 30.9 in adult 04/24/2018    Past Surgical History:  Procedure Laterality Date  . NECK SURGERY      Family History  Problem Relation Age of Onset  . Hypertension Mother   . Hypertension Father     Social History   Socioeconomic History  . Marital status: Single    Spouse name: Not on file  . Number of children: 2  . Years of education: Not on file  . Highest education level: Not on file  Occupational History  . Occupation: Sports administrator    Comment: [employer]  . 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 Occupation: [employer]  Social Needs  . Financial resource strain: Not on file  . Food insecurity    Worry: Never true    Inability: Never true  . Transportation needs    Medical: No    Non-medical: No  Tobacco Use  . Smoking status:  Never Smoker  . Smokeless tobacco: Never Used  Substance and Sexual Activity  . Alcohol use: Never    Frequency: Never  . Drug use: Never  . Sexual activity: Yes    Partners: Male    Birth control/protection: I.U.D.  Lifestyle  . Physical activity    Days per week: 0 days    Minutes per session: 0 min  . Stress: Very much  Relationships  . Social connections    Talks on phone: More than three times a week    Gets together: More than three times a week    Attends religious service: More than 4 times per year    Active member of club or organization: Yes    Attends meetings of clubs or organizations: Never    Relationship status: Never married  . Intimate partner violence    Fear of current or ex partner: No    Emotionally abused: No    Physically abused: No    Forced sexual activity: No  Other Topics Concern  . Not on file  Social History Narrative  . Not on file    Current Outpatient Medications:  .  Fexofenadine HCl (ALLEGRA PO), Take by mouth., Disp: , Rfl:  .  venlafaxine (EFFEXOR) 75 MG tablet, Take 75 mg by mouth 2 (two) times daily., Disp: , Rfl:   Allergies  Allergen Reactions  . Codeine     I personally reviewed active problem list, medication list, allergies, health maintenance, notes from last encounter, lab results with the patient/caregiver today.   ROS  Constitutional: Negative for fever or weight change.  Respiratory: Negative for cough and shortness of breath.   Cardiovascular: Negative for chest pain or palpitations.  Gastrointestinal: Negative for abdominal pain, no bowel changes.  Musculoskeletal: Negative for gait problem or joint swelling.  Skin: Negative for rash.  Neurological: Negative for dizziness or headache.  No other specific complaints in a complete review of systems (except as listed in HPI above).  Objective  Vitals:   01/18/19 1049  BP: 116/72  Pulse: 78  Resp: 16  Temp: 97.8 F (36.6 C)  TempSrc: Oral  SpO2: 94%   Weight: 181 lb 14.4 oz (82.5 kg)  Height: 5\' 2"  (1.575 m)    Body mass index is 33.27 kg/m.  Physical Exam  Constitutional: Patient appears well-developed and well-nourished. No distress.  HENT: Head: Normocephalic and atraumatic. Nose: Nose normal. Mouth/Throat: Oropharynx is clear and moist. No oropharyngeal exudate or tonsillar swelling.  Eyes: Conjunctivae and EOM are normal. No scleral icterus.   Neck: Normal range of motion. Neck supple. No JVD present. Cardiovascular: Normal rate, regular rhythm and normal heart sounds.  No murmur heard. No BLE edema. Pulmonary/Chest: Effort normal and breath sounds normal. No respiratory distress. Abdominal: Soft. Bowel sounds are normal, no distension. There is no tenderness. No masses. Musculoskeletal: Normal range of motion, no joint effusions. No gross deformities Neurological: Pt is alert and oriented to person, place, and time. No cranial nerve deficit. Coordination, balance, strength, speech and gait are normal.  Skin: Skin is warm and dry. No rash noted. No erythema.  Psychiatric: Patient has a normal mood and affect. behavior is normal. Judgment and thought content normal.  No results found for this or any previous visit (from the past 72 hour(s)).   PHQ2/9: Depression screen [facility] 2/9 01/18/2019 10/27/2018 04/24/2018  Decreased Interest 0 0 0  Down, Depressed, Hopeless 0 0 0  PHQ - 2 Score 0 0 0  Altered sleeping 0 0 0  Tired, decreased energy 0 0 0  Change in appetite 0 0 1  Feeling bad or failure about yourself  0 0 -  Trouble concentrating 0 0 3  Moving slowly or fidgety/restless 0 0 0  Suicidal thoughts 0 0 0  PHQ-9 Score 0 0 4  Difficult doing work/chores Not difficult at all Not difficult at all Somewhat difficult   PHQ-2/9 Result is negative.    Fall Risk: Fall Risk  01/18/2019 10/27/2018 04/24/2018  Falls in the past year? 0 0 0  Number falls in past yr: 0 0 0  Injury with Fall? 0 0 0  Follow up Falls evaluation  completed - Falls evaluation completed   Assessment & Plan  1. Preop examination - Her BMI needs to come down just a bit prior to meeting the listed requirements on her paperwork; she is considered low risk for surgery.  Labs per request from her surgeon. - CBC with Differential/Platelet - COMPLETE METABOLIC PANEL WITH GFR - TSH - INR/PT - Urinalysis, Complete - HIV Antibody (routine testing w rflx) - hCG, serum, qualitative

## 2019-01-19 ENCOUNTER — Encounter: Payer: Self-pay | Admitting: Family Medicine

## 2019-01-19 LAB — CBC WITH DIFFERENTIAL/PLATELET
Absolute Monocytes: 360 cells/uL (ref 200–950)
Basophils Absolute: 18 cells/uL (ref 0–200)
Basophils Relative: 0.4 %
Eosinophils Absolute: 131 cells/uL (ref 15–500)
Eosinophils Relative: 2.9 %
HCT: 44 % (ref 35.0–45.0)
Hemoglobin: 14.4 g/dL (ref 11.7–15.5)
Lymphs Abs: 2327 cells/uL (ref 850–3900)
MCH: 28.6 pg (ref 27.0–33.0)
MCHC: 32.7 g/dL (ref 32.0–36.0)
MCV: 87.3 fL (ref 80.0–100.0)
MPV: 10.3 fL (ref 7.5–12.5)
Monocytes Relative: 8 %
Neutro Abs: 1665 cells/uL (ref 1500–7800)
Neutrophils Relative %: 37 %
Platelets: 265 10*3/uL (ref 140–400)
RBC: 5.04 10*6/uL (ref 3.80–5.10)
RDW: 12.8 % (ref 11.0–15.0)
Total Lymphocyte: 51.7 %
WBC: 4.5 10*3/uL (ref 3.8–10.8)

## 2019-01-19 LAB — URINALYSIS, COMPLETE
Bacteria, UA: NONE SEEN /HPF
Bilirubin Urine: NEGATIVE
Glucose, UA: NEGATIVE
Hgb urine dipstick: NEGATIVE
Hyaline Cast: NONE SEEN /LPF
Ketones, ur: NEGATIVE
Leukocytes,Ua: NEGATIVE
Nitrite: NEGATIVE
Protein, ur: NEGATIVE
RBC / HPF: NONE SEEN /HPF (ref 0–2)
Specific Gravity, Urine: 1.025 (ref 1.001–1.03)
WBC, UA: NONE SEEN /HPF (ref 0–5)
pH: 5.5 (ref 5.0–8.0)

## 2019-01-19 LAB — COMPLETE METABOLIC PANEL WITH GFR
AG Ratio: 1.4 (calc) (ref 1.0–2.5)
ALT: 35 U/L — ABNORMAL HIGH (ref 6–29)
AST: 19 U/L (ref 10–30)
Albumin: 4.4 g/dL (ref 3.6–5.1)
Alkaline phosphatase (APISO): 52 U/L (ref 31–125)
BUN: 13 mg/dL (ref 7–25)
CO2: 26 mmol/L (ref 20–32)
Calcium: 9.7 mg/dL (ref 8.6–10.2)
Chloride: 103 mmol/L (ref 98–110)
Creat: 0.78 mg/dL (ref 0.50–1.10)
GFR, Est African American: 121 mL/min/{1.73_m2} (ref >=60)
GFR, Est Non African American: 104 mL/min/{1.73_m2} (ref >=60)
Globulin: 3.2 g/dL (calc) (ref 1.9–3.7)
Glucose, Bld: 87 mg/dL (ref 65–99)
Potassium: 4.2 mmol/L (ref 3.5–5.3)
Sodium: 137 mmol/L (ref 135–146)
Total Bilirubin: 0.3 mg/dL (ref 0.2–1.2)
Total Protein: 7.6 g/dL (ref 6.1–8.1)

## 2019-01-19 LAB — HCG, SERUM, QUALITATIVE: Preg, Serum: NEGATIVE

## 2019-01-19 LAB — TSH: TSH: 0.42 mIU/L

## 2019-01-19 LAB — PROTIME-INR
INR: 1.1
Prothrombin Time: 11.1 s (ref 9.0–11.5)

## 2019-01-19 LAB — HIV ANTIBODY (ROUTINE TESTING W REFLEX): HIV 1&2 Ab, 4th Generation: NONREACTIVE

## 2019-01-20 NOTE — Telephone Encounter (Signed)
Pt called back on this discussion and thought she had a miscall, still waiting for info FU when possible

## 2019-01-22 ENCOUNTER — Ambulatory Visit: Payer: Medicaid Other | Admitting: Family Medicine

## 2019-02-01 ENCOUNTER — Ambulatory Visit: Payer: Medicaid Other

## 2019-03-01 ENCOUNTER — Encounter: Payer: Self-pay | Admitting: Family Medicine

## 2019-03-01 ENCOUNTER — Ambulatory Visit (INDEPENDENT_AMBULATORY_CARE_PROVIDER_SITE_OTHER): Payer: Medicaid Other | Admitting: Family Medicine

## 2019-03-01 ENCOUNTER — Ambulatory Visit: Payer: Medicaid Other

## 2019-03-01 VITALS — Ht 62.0 in | Wt 175.0 lb

## 2019-03-01 DIAGNOSIS — N760 Acute vaginitis: Secondary | ICD-10-CM

## 2019-03-01 MED ORDER — METRONIDAZOLE 500 MG PO TABS: 500.0000 mg | ORAL_TABLET | Freq: Two times a day (BID) | ORAL | 0 refills | Status: DC

## 2019-03-01 NOTE — Progress Notes (Signed)
Name: Michele [name]   MRN: [id number]    DOB: Nov 08, 1990   Date:03/01/2019       Progress Note  Subjective:    Chief Complaint  Chief Complaint  Patient presents with  . Vaginal Discharge    Pt states get frequently, out of 6 months its probably her 4th one.  Onset 5 days with odor    I connected with  [name]  on 03/01/19 at  8:00 AM EST by a video enabled telemedicine application and verified that I am speaking with the correct person using two identifiers.  I discussed the limitations of evaluation and management by telemedicine and the availability of in person appointments. The patient expressed understanding and agreed to proceed. Staff also discussed with the patient that there may be a patient responsible charge related to this service. Patient Location: home Provider Location: [facility] Additional Individuals present: none  HPI Pt present with recurrent BV symptoms with vaginal discharge that is malodorous, vaginal irritation, and denies any associated urinary sx, abdominal pain, flank pain.  She had a change in laundry detergent that seems to have brought it on.  She's had BV several times this year, and always seems to be triggered by new soaps and detergents.  No pelvic pain, fever, chills, sweats, N/V/D bowel changes.  No risk of STDs.  Patient Active Problem List   Diagnosis Date Noted  . PSVT (paroxysmal supraventricular tachycardia) ([place]) 01/18/2019  . Memory changes 04/24/2018  . Left lumbar radiculitis 04/24/2018  . Class 1 obesity due to excess calories without serious comorbidity with body mass index (BMI) of 30.0 to 30.9 in adult 04/24/2018    Social History   Tobacco Use  . Smoking status: Never Smoker  . Smokeless tobacco: Never Used  Substance Use Topics  . Alcohol use: Never    Frequency: Never    No current outpatient medications on file.  Allergies  Allergen Reactions  . Codeine     I personally reviewed active problem list, medication  list, allergies, notes from last encounter, lab results with the patient/caregiver today.  Review of Systems  Constitutional: Negative.  Negative for chills and fever.  HENT: Negative.   Eyes: Negative.   Respiratory: Negative.   Cardiovascular: Negative.   Gastrointestinal: Negative.   Genitourinary: Negative.   Musculoskeletal: Negative.   Skin: Negative.  Negative for itching and rash.  Neurological: Negative.   Endo/Heme/Allergies: Negative.   Psychiatric/Behavioral: Negative.   All other systems reviewed and are negative.    Objective:   Virtual encounter, vitals limited, only able to obtain the following Today's Vitals   03/01/19 0810  Weight: 175 lb (79.4 kg)  Height: 5\' 2"  (1.575 m)   Body mass index is 32.01 kg/m. Nursing Note and Vital Signs reviewed.  Physical Exam Vitals signs and nursing note reviewed.  Constitutional:      General: She is not in acute distress.    Appearance: Normal appearance. She is not ill-appearing, toxic-appearing or diaphoretic.  HENT:     Head: Normocephalic and atraumatic.     Nose: Nose normal.  Skin:    Coloration: Skin is not jaundiced or pale.  Neurological:     Mental Status: She is alert.  Psychiatric:        Mood and Affect: Mood normal.        Behavior: Behavior normal.     PE limited by telephone encounter  No results found for this or any previous visit (from the past 72 hour(s)).  Assessment and Plan:     ICD-10-CM   1. Acute vaginitis  N76.0    pt suspects recurrent BV, clinically sounds like BV as well, will tx with flagyl, no red flags, pt encouraged to come in for PE/vaginal swab if not improving     -Red flags and when to present for emergency care or RTC including fever >101.62F, chest pain, shortness of breath, new/worsening/un-resolving symptoms,  reviewed with patient at time of visit. Follow up and care instructions discussed and provided in AVS. - I discussed the assessment and treatment plan with  the patient. The patient was provided an opportunity to ask questions and all were answered. The patient agreed with the plan and demonstrated an understanding of the instructions.  I provided 8 minutes of non-face-to-face time during this encounter.  Delsa Grana, PA-C 03/01/19 8:16 AM

## 2019-03-12 ENCOUNTER — Ambulatory Visit: Payer: Medicaid Other | Admitting: Family Medicine

## 2019-03-15 ENCOUNTER — Encounter: Payer: Self-pay | Admitting: Family Medicine

## 2019-03-15 ENCOUNTER — Ambulatory Visit: Payer: Medicaid Other | Admitting: Family Medicine

## 2019-03-15 ENCOUNTER — Other Ambulatory Visit: Payer: Self-pay

## 2019-03-15 VITALS — BP 118/80 | HR 90 | Temp 98.0°F | Resp 16 | Ht 62.0 in | Wt 175.7 lb

## 2019-03-15 DIAGNOSIS — Z01818 Encounter for other preprocedural examination: Secondary | ICD-10-CM

## 2019-03-15 DIAGNOSIS — I471 Supraventricular tachycardia: Secondary | ICD-10-CM

## 2019-03-15 DIAGNOSIS — M5432 Sciatica, left side: Secondary | ICD-10-CM

## 2019-03-15 MED ORDER — TIZANIDINE HCL 2 MG PO TABS: 2.0000 mg | ORAL_TABLET | Freq: Three times a day (TID) | ORAL | 0 refills | Status: DC | PRN

## 2019-03-15 NOTE — Progress Notes (Signed)
Name: Michele [name]   MRN: [id number]    DOB: 12-14-1990   Date:03/15/2019       Progress Note  Subjective  Chief Complaint  Chief Complaint  Patient presents with  . Consult    LIposuction surgery schedule for 12/17 in [place]    HPI  Pt presents for pre-surgical clearance.  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  She is requiring several labs, EKG, and medical clearance.    - She does have history of PSVT - last episode was 2015 - has completely resolved since avoiding caffeine and getting more sleep.  Has seen cardiology with last visit in 2014 (Dr. Marcello Moores) She used to take metoprolol, but came off of it a few years ago and has not had an epidode since then. - She does have history of sciatica and is concerned that she will have flare up after her surgery.  She notes she is given percocet  - TDAP UTD  Patient Active Problem List   Diagnosis Date Noted  . PSVT (paroxysmal supraventricular tachycardia) ([place]) 01/18/2019  . Memory changes 04/24/2018  . Left lumbar radiculitis 04/24/2018  . Class 1 obesity due to excess calories without serious comorbidity with body mass index (BMI) of 30.0 to 30.9 in adult 04/24/2018    Past Surgical History:  Procedure Laterality Date  . NECK SURGERY      Family History  Problem Relation Age of Onset  . Hypertension Mother   . Hypertension Father     Social History   Socioeconomic History  . Marital status: Single    Spouse name: Not on file  . Number of children: 2  . Years of education: Not on file  . Highest education level: Not on file  Occupational History  . Occupation: Sports administrator    Comment: [employer]  . 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 Occupation: [employer]  Social Needs  . Financial resource strain: Not on file  . Food insecurity    Worry: Never true    Inability: Never true  . Transportation needs    Medical: No    Non-medical: No   Tobacco Use  . Smoking status: Never Smoker  . Smokeless tobacco: Never Used  Substance and Sexual Activity  . Alcohol use: Never    Frequency: Never  . Drug use: Never  . Sexual activity: Yes    Partners: Male    Birth control/protection: I.U.D.  Lifestyle  . Physical activity    Days per week: 0 days    Minutes per session: 0 min  . Stress: Very much  Relationships  . Social connections    Talks on phone: More than three times a week    Gets together: More than three times a week    Attends religious service: More than 4 times per year    Active member of club or organization: Yes    Attends meetings of clubs or organizations: Never    Relationship status: Never married  . Intimate partner violence    Fear of current or ex partner: No    Emotionally abused: No    Physically abused: No    Forced sexual activity: No  Other Topics Concern  . Not on file  Social History Narrative  . Not on file     Current Outpatient Medications:  .  metroNIDAZOLE (FLAGYL) 500 MG tablet, Take 1 tablet (500 mg total) by  mouth 2 (two) times daily. (Patient not taking: Reported on 03/15/2019), Disp: 14 tablet, Rfl: 0  Allergies  Allergen Reactions  . Codeine     I personally reviewed active problem list, medication list, allergies, notes from last encounter, lab results with the patient/caregiver today.   ROS  Ten systems reviewed and is negative except as mentioned in HPI  Objective  Vitals:   03/15/19 0728  BP: 118/80  Pulse: 90  Resp: 16  Temp: 98 F (36.7 C)  TempSrc: Temporal  SpO2: 94%  Weight: 175 lb 11.2 oz (79.7 kg)  Height: 5\' 2"  (1.575 m)    Body mass index is 32.14 kg/m.  Physical Exam  Constitutional: Patient appears well-developed and well-nourished. No distress.  HENT: Head: Normocephalic and atraumatic. Ears: bilateral TMs with no erythema or effusion; Nose: Nose normal. Mouth/Throat: Oropharynx is clear and moist. No oropharyngeal exudate or tonsillar  swelling.  Eyes: Conjunctivae and EOM are normal. No scleral icterus.  Pupils are equal, round, and reactive to light.  Neck: Normal range of motion. Neck supple. No JVD present. No thyromegaly present.  Cardiovascular: Normal rate, regular rhythm and normal heart sounds.  No murmur heard. No BLE edema. Pulmonary/Chest: Effort normal and breath sounds normal. No respiratory distress. Abdominal: Soft. Bowel sounds are normal, no distension. There is no tenderness. No masses. Musculoskeletal: Normal range of motion, no joint effusions. No gross deformities Neurological: Pt is alert and oriented to person, place, and time. No cranial nerve deficit. Coordination, balance, strength, speech and gait are normal.  Skin: Skin is warm and dry. No rash noted. No erythema.  Psychiatric: Patient has a normal mood and affect. behavior is normal. Judgment and thought content normal.  No results found for this or any previous visit (from the past 72 hour(s)).   PHQ2/9: Depression screen [facility] 2/9 03/15/2019 03/01/2019 01/18/2019 10/27/2018 04/24/2018  Decreased Interest 0 0 0 0 0  Down, Depressed, Hopeless 0 0 0 0 0  PHQ - 2 Score 0 0 0 0 0  Altered sleeping 0 0 0 0 0  Tired, decreased energy 0 0 0 0 0  Change in appetite 0 0 0 0 1  Feeling bad or failure about yourself  0 0 0 0 -  Trouble concentrating 0 0 0 0 3  Moving slowly or fidgety/restless 0 0 0 0 0  Suicidal thoughts 0 0 0 0 0  PHQ-9 Score 0 0 0 0 4  Difficult doing work/chores Not difficult at all Not difficult at all Not difficult at all Not difficult at all Somewhat difficult   PHQ-2/9 Result is negative.    Fall Risk: Fall Risk  03/15/2019 03/01/2019 01/18/2019 10/27/2018 04/24/2018  Falls in the past year? 0 0 0 0 0  Number falls in past yr: 0 0 0 0 0  Injury with Fall? 0 0 0 0 0  Follow up Falls evaluation completed - Falls evaluation completed - Falls evaluation completed   Assessment & Plan  1. Pre-operative clearance - CBC with  Differential/Platelet - INR/PT - PTT - hCG, serum, qualitative - Thyroid Panel With TSH - Urinalysis, Complete - COMPLETE METABOLIC PANEL WITH GFR - EKG 12-Lead - Sinus rhythm, no changes from prior ECG in October 2020.   - HIV antibody (with reflex) - IgM  2. Sciatica, left side - tiZANidine (ZANAFLEX) 2 MG tablet; Take 1 tablet (2 mg total) by mouth every 8 (eight) hours as needed for muscle spasms. DO NOT TAKE with Percocet.  Dispense:  10 tablet; Refill: 0  3. PSVT (paroxysmal supraventricular tachycardia) ([facility]) - ECG in sinus rhythm with no changes from prior ECG in October 2020.  Did discuss her history with patient and with Dr. Ancil Boozer, attending physician.  She has been without symptoms or tachycardia, without rate controlled medication, for about 5 years.  She has no other risk factors for poor cardiac outcome aside from obesity. Body mass index is 32.14 kg/m.

## 2019-03-16 LAB — URINALYSIS, COMPLETE
Bacteria, UA: NONE SEEN /HPF
Bilirubin Urine: NEGATIVE
Glucose, UA: NEGATIVE
Hgb urine dipstick: NEGATIVE
Hyaline Cast: NONE SEEN /LPF
Ketones, ur: NEGATIVE
Leukocytes,Ua: NEGATIVE
Nitrite: NEGATIVE
Protein, ur: NEGATIVE
RBC / HPF: NONE SEEN /HPF (ref 0–2)
Specific Gravity, Urine: 1.018 (ref 1.001–1.03)
WBC, UA: NONE SEEN /HPF (ref 0–5)
pH: 5.5 (ref 5.0–8.0)

## 2019-03-16 LAB — COMPLETE METABOLIC PANEL WITH GFR
AG Ratio: 1.5 (calc) (ref 1.0–2.5)
ALT: 30 U/L — ABNORMAL HIGH (ref 6–29)
AST: 18 U/L (ref 10–30)
Albumin: 4.5 g/dL (ref 3.6–5.1)
Alkaline phosphatase (APISO): 54 U/L (ref 31–125)
BUN: 11 mg/dL (ref 7–25)
CO2: 28 mmol/L (ref 20–32)
Calcium: 9.6 mg/dL (ref 8.6–10.2)
Chloride: 104 mmol/L (ref 98–110)
Creat: 0.67 mg/dL (ref 0.50–1.10)
GFR, Est African American: 140 mL/min/{1.73_m2} (ref >=60)
GFR, Est Non African American: 120 mL/min/{1.73_m2} (ref >=60)
Globulin: 3.1 g/dL (calc) (ref 1.9–3.7)
Glucose, Bld: 93 mg/dL (ref 65–99)
Potassium: 4.4 mmol/L (ref 3.5–5.3)
Sodium: 138 mmol/L (ref 135–146)
Total Bilirubin: 0.3 mg/dL (ref 0.2–1.2)
Total Protein: 7.6 g/dL (ref 6.1–8.1)

## 2019-03-16 LAB — CBC WITH DIFFERENTIAL/PLATELET
Absolute Monocytes: 424 cells/uL (ref 200–950)
Basophils Absolute: 29 cells/uL (ref 0–200)
Basophils Relative: 0.7 %
Eosinophils Absolute: 130 cells/uL (ref 15–500)
Eosinophils Relative: 3.1 %
HCT: 42 % (ref 35.0–45.0)
Hemoglobin: 14 g/dL (ref 11.7–15.5)
Lymphs Abs: 2024 cells/uL (ref 850–3900)
MCH: 28.6 pg (ref 27.0–33.0)
MCHC: 33.3 g/dL (ref 32.0–36.0)
MCV: 85.7 fL (ref 80.0–100.0)
MPV: 10.3 fL (ref 7.5–12.5)
Monocytes Relative: 10.1 %
Neutro Abs: 1592 cells/uL (ref 1500–7800)
Neutrophils Relative %: 37.9 %
Platelets: 268 10*3/uL (ref 140–400)
RBC: 4.9 10*6/uL (ref 3.80–5.10)
RDW: 12.6 % (ref 11.0–15.0)
Total Lymphocyte: 48.2 %
WBC: 4.2 10*3/uL (ref 3.8–10.8)

## 2019-03-16 LAB — IGM: IgM, Serum: 245 mg/dL (ref 50–300)

## 2019-03-16 LAB — PROTIME-INR
INR: 1.1
Prothrombin Time: 11.2 s (ref 9.0–11.5)

## 2019-03-16 LAB — THYROID PANEL WITH TSH
Free Thyroxine Index: 2 (ref 1.4–3.8)
T3 Uptake: 32 % (ref 22–35)
T4, Total: 6.4 ug/dL (ref 5.1–11.9)
TSH: 0.59 mIU/L

## 2019-03-16 LAB — HCG, SERUM, QUALITATIVE: Preg, Serum: NEGATIVE

## 2019-03-16 LAB — APTT: aPTT: 28 s (ref 23–32)

## 2019-03-16 LAB — HIV ANTIBODY (ROUTINE TESTING W REFLEX): HIV 1&2 Ab, 4th Generation: NONREACTIVE

## 2019-03-22 DIAGNOSIS — Z9189 Other specified personal risk factors, not elsewhere classified: Secondary | ICD-10-CM | POA: Diagnosis not present

## 2019-03-22 DIAGNOSIS — Z20828 Contact with and (suspected) exposure to other viral communicable diseases: Secondary | ICD-10-CM | POA: Diagnosis not present

## 2019-03-25 HISTORY — PX: LIPOSUCTION AUOLOGOUS FAT TRANSFER TO BUTTOCKS: SHX6685

## 2019-04-07 ENCOUNTER — Telehealth: Payer: Self-pay | Admitting: Emergency Medicine

## 2019-04-07 NOTE — Telephone Encounter (Signed)
Copied from [place] [id number]. Topic: General - Other >> Apr 06, 2019  9:49 AM Greggory Keen D wrote: Reason for CRM: pt called saying she needs a letter for insurance saying she no longer need therapy that was earlier suggested for anxiety.  She needs this for her insurance.  CB#  [phone]

## 2019-04-07 NOTE — Telephone Encounter (Signed)
Had referral to therapy for depression and anxiety and she did not go. Insurance need letter stating that it is no longer needed.

## 2019-04-08 NOTE — Telephone Encounter (Signed)
There is no referral in her chart for counseling, and I don't see where we have discussed her anxiety in the last year.  Would need appt to follow up on this.

## 2019-04-12 NOTE — Telephone Encounter (Signed)
Please call and make appointment for this patient a virtual with Raquel Sarna

## 2019-04-12 NOTE — Telephone Encounter (Signed)
lvm asking pt to return call to schedule virtual appt with Raquel Sarna

## 2019-06-26 DIAGNOSIS — J069 Acute upper respiratory infection, unspecified: Secondary | ICD-10-CM | POA: Diagnosis not present

## 2019-06-26 DIAGNOSIS — Z03818 Encounter for observation for suspected exposure to other biological agents ruled out: Secondary | ICD-10-CM | POA: Diagnosis not present

## 2019-06-26 DIAGNOSIS — R05 Cough: Secondary | ICD-10-CM | POA: Diagnosis not present

## 2019-06-30 DIAGNOSIS — J209 Acute bronchitis, unspecified: Secondary | ICD-10-CM | POA: Diagnosis not present

## 2019-06-30 DIAGNOSIS — R0602 Shortness of breath: Secondary | ICD-10-CM | POA: Diagnosis not present

## 2019-06-30 DIAGNOSIS — R05 Cough: Secondary | ICD-10-CM | POA: Diagnosis not present

## 2019-06-30 DIAGNOSIS — N915 Oligomenorrhea, unspecified: Secondary | ICD-10-CM | POA: Diagnosis not present

## 2019-07-08 DIAGNOSIS — J302 Other seasonal allergic rhinitis: Secondary | ICD-10-CM | POA: Diagnosis not present

## 2019-07-08 DIAGNOSIS — R0602 Shortness of breath: Secondary | ICD-10-CM | POA: Diagnosis not present

## 2019-07-08 DIAGNOSIS — R05 Cough: Secondary | ICD-10-CM | POA: Diagnosis not present

## 2019-07-09 ENCOUNTER — Telehealth: Payer: Medicaid Other | Admitting: Family Medicine

## 2019-07-12 DIAGNOSIS — R0602 Shortness of breath: Secondary | ICD-10-CM | POA: Diagnosis not present

## 2019-07-13 ENCOUNTER — Telehealth: Payer: Self-pay | Admitting: Family Medicine

## 2019-07-13 NOTE — Telephone Encounter (Signed)
Copied from [place] [id number]. Topic: Referral - Request for Referral >> Jul 12, 2019 12:00 PM Richardo Priest, [place] wrote: Has patient seen PCP for this complaint? yes *If NO, is insurance requiring patient see PCP for this issue before PCP can refer them? Referral for which specialty: Orthopedic Preferred provider/office: [facility] Reason for referral: old referral expired

## 2019-07-15 ENCOUNTER — Ambulatory Visit (INDEPENDENT_AMBULATORY_CARE_PROVIDER_SITE_OTHER): Payer: Medicaid Other | Admitting: Family Medicine

## 2019-07-15 ENCOUNTER — Other Ambulatory Visit: Payer: Self-pay

## 2019-07-15 ENCOUNTER — Encounter: Payer: Self-pay | Admitting: Family Medicine

## 2019-07-15 DIAGNOSIS — R05 Cough: Secondary | ICD-10-CM

## 2019-07-15 DIAGNOSIS — M5416 Radiculopathy, lumbar region: Secondary | ICD-10-CM

## 2019-07-15 DIAGNOSIS — D492 Neoplasm of unspecified behavior of bone, soft tissue, and skin: Secondary | ICD-10-CM | POA: Diagnosis not present

## 2019-07-15 DIAGNOSIS — R053 Chronic cough: Secondary | ICD-10-CM

## 2019-07-15 NOTE — Progress Notes (Signed)
Name: Zykiria Guttilla   MRN: [id number]    DOB: 11/14/1990   Date:07/15/2019       Progress Note  Subjective  Chief Complaint  Chief Complaint  Patient presents with  . Referral    back pain      I connected with  [name] on 07/15/19 at  8:40 AM EDT by telephone and verified that I am speaking with the correct person using two identifiers.   I discussed the limitations, risks, security and privacy concerns of performing an evaluation and management service by telephone and the availability of in person appointments. Staff also discussed with the patient that there may be a patient responsible charge related to this service. Patient Location: at home   Provider Location: [facility]  HPI  Back Pain: low back pain with radiculitis down left leg. She states symptoms started 05/2018, started suddenly. She states at the time she was working out a lot. She tried doing home stretching and did not improve. She was given muscle relaxer but it made her very tired. She is now a mother and is ready to find out what is going on to relieve the pain and numbness down left lower leg. She states when she has a flare she has difficulty walking or moving. Daily pain is described as numbness and sharp and 5/10 during flares up to 10/10. No bowel or bladder incontinence   Cervical spine tumor:; benign, had debulking back in 2011 by Dr. Bard Herbert at [facility], at the time she was having weakness and numbness on right arm, it was an emergency procedure. She still has some right side weakness, but denies neck pain or numbness. She was supposed to have a MRI back in 2017 but did not have it done. Explained need to follow up with neurosurgeon. She would like to go back to the same office.   Bronchitis: she has been coughing for over two weeks, went to Urgent care and was given antibiotics, prednisone and inhalers, but continues to have a productive cough . She is taking Breo and also albuterol COVID-19  negative. She is worried because she is not improving. She also has wheezing and SOB. She is not a smoker, no history of asthma   Patient Active Problem List   Diagnosis Date Noted  . PSVT (paroxysmal supraventricular tachycardia) ([place]) 01/18/2019  . Memory changes 04/24/2018  . Left lumbar radiculitis 04/24/2018  . Class 1 obesity due to excess calories without serious comorbidity with body mass index (BMI) of 30.0 to 30.9 in adult 04/24/2018    Social History   Tobacco Use  . Smoking status: Never Smoker  . Smokeless tobacco: Never Used  Substance Use Topics  . Alcohol use: Never     Current Outpatient Medications:  .  loratadine (CLARITIN) 10 MG tablet, Take 10 mg by mouth daily., Disp: , Rfl:  .  tiZANidine (ZANAFLEX) 2 MG tablet, Take 1 tablet (2 mg total) by mouth every 8 (eight) hours as needed for muscle spasms. DO NOT TAKE with Percocet., Disp: 10 tablet, Rfl: 0  Allergies  Allergen Reactions  . Codeine     I personally reviewed active problem list, medication list, allergies, family history, social history, health maintenance with the patient/caregiver today.  ROS  Ten systems reviewed and is negative except as mentioned in HPI   Objective  Virtual encounter, vitals not obtained.  There is no height or weight on file to calculate BMI.  Nursing Note and Vital Signs reviewed.  Physical Exam  Awake, alert and oriented Constantly coughing/ seems wet   Assessment & Plan  1. Cervical spine tumor  - Ambulatory referral to Neurosurgery  2. Left lumbar radiculitis  - Ambulatory referral to Neurosurgery  3. Chronic cough  - Ambulatory referral to Pulmonology   -Red flags and when to present for emergency care or RTC including fever >101.83F, chest pain, shortness of breath, new/worsening/un-resolving symptoms,  reviewed with patient at time of visit. Follow up and care instructions discussed and provided in AVS. - I discussed the assessment and treatment  plan with the patient. The patient was provided an opportunity to ask questions and all were answered. The patient agreed with the plan and demonstrated an understanding of the instructions.  - The patient was advised to call back or seek an in-person evaluation if the symptoms worsen or if the condition fails to improve as anticipated.  I provided 25  minutes of non-face-to-face time during this encounter.  Loistine Chance, MD

## 2019-07-16 ENCOUNTER — Encounter: Payer: Self-pay | Admitting: Internal Medicine

## 2019-07-16 ENCOUNTER — Ambulatory Visit: Payer: Medicaid Other | Admitting: Internal Medicine

## 2019-07-16 DIAGNOSIS — R058 Other specified cough: Secondary | ICD-10-CM | POA: Insufficient documentation

## 2019-07-16 DIAGNOSIS — R05 Cough: Secondary | ICD-10-CM | POA: Diagnosis not present

## 2019-07-16 MED ORDER — FAMOTIDINE 20 MG PO TABS: ORAL_TABLET | ORAL | 11 refills | Status: DC

## 2019-07-16 MED ORDER — PREDNISONE 10 MG PO TABS: ORAL_TABLET | ORAL | 0 refills | Status: DC

## 2019-07-16 MED ORDER — PANTOPRAZOLE SODIUM 40 MG PO TBEC: 40.0000 mg | DELAYED_RELEASE_TABLET | Freq: Every day | ORAL | 2 refills | Status: DC

## 2019-07-16 MED ORDER — AMOXICILLIN-POT CLAVULANATE 875-125 MG PO TABS: 1.0000 | ORAL_TABLET | Freq: Two times a day (BID) | ORAL | 0 refills | Status: DC

## 2019-07-16 MED ORDER — HYDROCODONE-HOMATROPINE 5-1.5 MG/5ML PO SYRP: 5.0000 mL | ORAL_SOLUTION | ORAL | 0 refills | Status: DC | PRN

## 2019-07-16 NOTE — Assessment & Plan Note (Addendum)
Onset early March 2021 and did not respond to asthma meds  - cyclical cough rx [id number]  And f/u one week   The most common causes of chronic cough in immunocompetent adults include the following: upper airway cough syndrome (UACS), previously referred to as postnasal drip syndrome (PNDS), which is caused by variety of rhinosinus conditions; (2) asthma; (3) GERD; (4) chronic bronchitis from cigarette smoking or other inhaled environmental irritants; (5) nonasthmatic eosinophilic bronchitis; and (6) bronchiectasis.   These conditions, singly or in combination, have accounted for up to 94% of the causes of chronic cough in prospective studies.   Other conditions have constituted no >6% of the causes in prospective studies These have included bronchogenic carcinoma, chronic interstitial pneumonia, sarcoidosis, left ventricular failure, ACEI-induced cough, and aspiration from a condition associated with pharyngeal dysfunction.    Chronic cough is often simultaneously caused by more than one condition. A single cause has been found from 38 to 82% of the time, multiple causes from 18 to 62%. Multiply caused cough has been the result of three diseases up to 42% of the time.       Of the three most common causes of  Sub-acute / recurrent or chronic cough, only one (GERD)  can actually contribute to/ trigger  the other two (asthma and post nasal drip syndrome)  and perpetuate the cylce of cough.  While not intuitively obvious, many patients with chronic low grade reflux do not cough until there is a primary insult that disturbs the protective epithelial barrier and exposes sensitive nerve endings.   This is typically viral but can due to PNDS and  either may apply here.    >>>  The point is that once this occurs, it is difficult to eliminate the cycle  using anything but a maximally effective acid suppression regimen at least in the short run, accompanied by an appropriate diet to address non acid GERD and  control / eliminate the cough itself for at least 3 days with hycodan, rx as sinusitis with 10 d augmentin, and  Also added 6 days of Prednisone in case of component of T-2 driven upper or lower airways inflammation (if cough responds short term only to relapse before return while will on rx for uacs that would point to allergic rhinitis/ asthma or eos bronchitis)     >>> f/u in one week - call sooner if needed          Each maintenance medication was reviewed in detail including emphasizing most importantly the difference between maintenance and prns and under what circumstances the prns are to be triggered using an action plan format where appropriate.  Total time for H and P, chart review, counseling using printed and hand generated graphics on pathophysiology and rx for cyclical cough and generating customized AVS unique to this office visit / charting = 60 min

## 2019-07-16 NOTE — Patient Instructions (Signed)
The key to effective treatment for your cough is eliminating the non-stop cycle of cough you're stuck in long enough to let your airway heal completely and then see if there is anything still making you cough once you stop the cough suppression, but this should take no more than 5 days to figure out  mucinex dm 1200 mg every 12 hours supplement with hydromet as needed      Augmentin 875 mg take one pill twice daily  X 10 days - take at breakfast and supper with large glass of water.  It would help reduce the usual side effects (diarrhea and yeast infections) if you ate cultured yogurt at lunch.      Prednisone 10 mg take  4 each am x 2 days,   2 each am x 2 days,  1 each am x 2 days and stop (this is to eliminate allergies and inflammation from coughing)  Protonix (pantoprazole) Take 30-60 min before first meal of the day and Pepcid 20 mg one right after supper until return     GERD (REFLUX)  is an extremely common cause of respiratory symptoms, many times with no significant heartburn at all.    It can be treated with medication, but also with lifestyle changes including avoidance of late meals, excessive alcohol, smoking cessation, and avoid fatty foods, chocolate, peppermint, colas, red wine, and acidic juices such as orange juice.  NO MINT OR MENTHOL PRODUCTS SO NO COUGH DROPS   USE HARD CANDY INSTEAD (jolley ranchers or Stover's or Lifesavers (all available in sugarless versions) NO OIL BASED VITAMINS - use powdered substitutes.  For breathing can use nebulizer up to every 4 hours as needed  Stop singulair, proair, zyrtec, and Breo    Return in one week, call sooner if needed

## 2019-07-16 NOTE — Progress Notes (Signed)
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 Consult  onset abrupt in early March 2021 with typical nasal congestion/ thought it was allergy rx clariton and mucinex but then cough much worse to point of vomiting/urinary incont  and mulitple ov's rx singulair, pred, breo, alb multiple forms and not better Dyspnea: feeling like drowning in mucus but minimal actual pruction yellow mucus same from nose Cough: worsening around 6 pm after supper  Sleep: sitting up 60 degrees  SABA use: nebulizer every 3-4hours may help some  zpak no better  No obvious day to day or daytime variability or assoc  hemoptysis or cp or chest tightness, subjective wheeze or overt sinus or hb symptoms.     Also denies any obvious fluctuation of symptoms with weather or environmental changes or other aggravating or alleviating factors except as outlined above   No unusual exposure hx or h/o childhood pna/ asthma or knowledge of premature birth.  Current Allergies, Complete Past Medical History, Past Surgical History, Family History, and Social History were reviewed in [employer] record.  ROS  The following are not active complaints unless bolded Hoarseness, sore throat, dysphagia, dental problems, itching, sneezing,  nasal congestion or discharge of excess mucus or purulent  secretions, ear ache,   fever, chills, sweats, unintended wt loss or wt gain, classically pleuritic or exertional cp,  orthopnea pnd or arm/hand swelling  or leg swelling, presyncope, palpitations, abdominal pain, anorexia, nausea, vomiting, diarrhea  or change in bowel habits or change in bladder habits, change in stools or change in urine, dysuria, hematuria,  rash, arthralgias, visual complaints, headache, numbness, weakness or ataxia or problems with walking or coordination,  change in mood or  memory.           No past medical history on file.  Outpatient Medications Prior to Visit  Medication Sig Dispense Refill  . albuterol (PROVENTIL) (5 MG/ML) 0.5% nebulizer solution Take 2.5 mg by nebulization every 6 (six) hours as needed for wheezing or shortness of breath.    Marland Kitchen albuterol (VENTOLIN HFA) 108 (90 Base) MCG/ACT inhaler Inhale into the lungs every 4 (four) hours as needed for wheezing or shortness of breath.    . cetirizine (ZYRTEC) 10 MG chewable tablet Chew 10 mg by mouth daily.    Marland Kitchen dextromethorphan-guaiFENesin (MUCINEX DM) 30-600 MG 12hr tablet Take 1 tablet by mouth 2 (two) times daily.    . fluticasone furoate-vilanterol (BREO ELLIPTA) 100-25 MCG/INH AEPB Inhale 1 puff into the lungs daily.    Marland Kitchen HYDROcodone-homatropine (HYCODAN) 5-1.5 MG/5ML syrup Take 5 mLs by mouth 2 (two) times daily as needed for cough.    Marland Kitchen ipratropium-albuterol (DUONEB) 0.5-2.5 (3) MG/3ML SOLN Take 3 mLs by nebulization every 4 (four) hours as needed.    . loratadine (CLARITIN) 10 MG  tablet Take 10 mg by mouth daily.    . montelukast (SINGULAIR) 10 MG tablet Take 10 mg by mouth daily.    Marland Kitchen tiZANidine (ZANAFLEX) 2 MG tablet Take 1 tablet (2 mg total) by mouth every 8 (eight) hours as needed for muscle spasms. DO NOT TAKE with Percocet. 10 tablet 0         Objective:     BP 122/74 (BP Location: Left Arm, Cuff Size: Normal)   Pulse 96   Temp 98.2 F (36.8 C) (Temporal)   Ht 5\' 2"  (1.575 m)   Wt 169 lb  3.2 oz (76.7 kg)   SpO2 95% Comment: RA  BMI 30.95 kg/m   SpO2: 95 %(RA)   Ambulatory bf last neb > 12 h prior to ov    HEENT : pt wearing mask not removed for exam due to covid -19 concerns.    NECK :  without JVD/Nodes/TM/ nl carotid upstrokes bilaterally/ scar base of R neck at sternocleidomastoid insertion    LUNGS: no acc muscle use,  Nl contour chest with prominent pseudowheeze better with plm / panting  And lots of transmitted upper airway noise on exp  without cough on insp or exp maneuvers   CV:  RRR  no s3 or murmur or increase in P2, and no edema   ABD:  soft and nontender with nl inspiratory excursion in the supine position. No bruits or organomegaly appreciated, bowel sounds nl  MS:  Nl gait/ ext warm without deformities, calf tenderness, cyanosis or clubbing No obvious joint restrictions   SKIN: warm and dry without lesions    NEURO:  alert, approp, nl sensorium with  no motor or cerebellar deficits apparent.     cxr ok July 07 2018 [facility]    Assessment   Upper airway cough syndrome Onset early March 2021 and did not respond to asthma meds  - cyclical cough rx [id number]  And f/u one week   The most common causes of chronic cough in immunocompetent adults include the following: upper airway cough syndrome (UACS), previously referred to as postnasal drip syndrome (PNDS), which is caused by variety of rhinosinus conditions; (2) asthma; (3) GERD; (4) chronic bronchitis from cigarette smoking or other inhaled environmental irritants; (5) nonasthmatic eosinophilic bronchitis; and (6) bronchiectasis.   These conditions, singly or in combination, have accounted for up to 94% of the causes of chronic cough in prospective studies.   Other conditions have constituted no >6% of the causes in prospective studies These have included bronchogenic carcinoma, chronic interstitial pneumonia, sarcoidosis, left ventricular failure, ACEI-induced cough, and aspiration from  a condition associated with pharyngeal dysfunction.    Chronic cough is often simultaneously caused by more than one condition. A single cause has been found from 38 to 82% of the time, multiple causes from 18 to 62%. Multiply caused cough has been the result of three diseases up to 42% of the time.       Of the three most common causes of  Sub-acute / recurrent or chronic cough, only one (GERD)  can actually contribute to/ trigger  the other two (asthma and post nasal drip syndrome)  and perpetuate the cylce of cough.  While not intuitively obvious, many patients with chronic low grade reflux do not cough until there is a primary insult that disturbs the protective epithelial barrier and exposes sensitive nerve endings.   This is typically viral but can due to PNDS and  either may apply here.    >>>  The point is that once this occurs, it is difficult to eliminate the cycle  using anything but a maximally effective acid suppression regimen at least in the short run, accompanied by an appropriate diet to address non acid GERD and control / eliminate the cough itself for at least 3 days with hycodan, rx as sinusitis with 10 d augmentin, and  Also added 6 days of Prednisone in case of component of T-2 driven upper or lower airways inflammation (if cough responds short term only to relapse before return while will on rx for uacs that would point to allergic rhinitis/ asthma or eos bronchitis)     >>> f/u in one week - call sooner if needed     Each maintenance medication was reviewed in detail including emphasizing most importantly the difference between maintenance and prns and under what circumstances the prns are to be triggered using an action plan format where appropriate.  Total time for H and P, chart review, counseling using printed and hand generated graphics on pathophysiology and rx for cyclical cough and generating customized AVS unique to this office visit / charting = 60 min            Christinia Gully, MD 07/16/2019

## 2019-07-22 ENCOUNTER — Ambulatory Visit (INDEPENDENT_AMBULATORY_CARE_PROVIDER_SITE_OTHER): Payer: Medicaid Other

## 2019-07-22 ENCOUNTER — Telehealth: Payer: Self-pay | Admitting: Internal Medicine

## 2019-07-22 ENCOUNTER — Encounter: Payer: Self-pay | Admitting: Internal Medicine

## 2019-07-22 ENCOUNTER — Other Ambulatory Visit: Payer: Self-pay

## 2019-07-22 ENCOUNTER — Ambulatory Visit (INDEPENDENT_AMBULATORY_CARE_PROVIDER_SITE_OTHER): Payer: Medicaid Other | Admitting: Internal Medicine

## 2019-07-22 DIAGNOSIS — R05 Cough: Secondary | ICD-10-CM | POA: Diagnosis not present

## 2019-07-22 DIAGNOSIS — R058 Other specified cough: Secondary | ICD-10-CM

## 2019-07-22 DIAGNOSIS — R059 Cough, unspecified: Secondary | ICD-10-CM

## 2019-07-22 DIAGNOSIS — M5416 Radiculopathy, lumbar region: Secondary | ICD-10-CM | POA: Diagnosis not present

## 2019-07-22 DIAGNOSIS — I471 Supraventricular tachycardia: Secondary | ICD-10-CM | POA: Diagnosis not present

## 2019-07-22 MED ORDER — DM-GUAIFENESIN ER 30-600 MG PO TB12: ORAL_TABLET | ORAL | Status: DC

## 2019-07-22 MED ORDER — ALBUTEROL SULFATE (2.5 MG/3ML) 0.083% IN NEBU: 2.5000 mg | INHALATION_SOLUTION | RESPIRATORY_TRACT | 12 refills | Status: DC | PRN

## 2019-07-22 MED ORDER — METHYLPREDNISOLONE ACETATE 80 MG/ML IJ SUSP
120.0000 mg | Freq: Once | INTRAMUSCULAR | Status: AC
  Administered 2019-07-22: 120 mg via INTRAMUSCULAR

## 2019-07-22 MED ORDER — PREDNISONE 10 MG PO TABS: ORAL_TABLET | ORAL | 0 refills | Status: DC

## 2019-07-22 MED ORDER — FLUTTER DEVI: 0 refills | Status: DC

## 2019-07-22 NOTE — Telephone Encounter (Signed)
LMTCB x1 for pt.  

## 2019-07-22 NOTE — Patient Instructions (Addendum)
Make sure the mucinex dm is 1200 mg every 12 hours as needed for cough or congestion and use the flutter valve as possible   For breathing > albuterol nebulizers every 4 hours as needed   We will call to schedule a CT of sinus   Prednisone 10 mg Take 4 for three days 3 for three days 2 for three days 1 for three days and stop   Please remember to go to the  x-ray department  for your tests - we will call you with the results when they are available    Please schedule a follow up office visit in 2 weeks, sooner if needed  with all medications /inhalers/ solutions in hand so we can verify exactly what you are taking. This includes all medications from all doctors and over the counters

## 2019-07-22 NOTE — Progress Notes (Signed)
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     History of Present Illness  07/16/2019  Pulmonary/ 1st office eval/Michele [name]  Chief Complaint  Patient presents with  . Consult  onset abrupt in early March 2021 with typical nasal congestion/ thought it was allergy rx clariton and mucinex but then cough much worse to point of vomiting/urinary incont  and multiple ov's rx singulair, pred, breo, alb multiple forms and not better Dyspnea: feeling like drowning in mucus but minimal actual pruction yellow mucus same from nose Cough: worsening around 6 pm after supper  Sleep: sitting up 60 degrees  SABA use: nebulizer every 3-4hours may help some  zpak no better rec The key to effective treatment for your cough is eliminating the non-stop cycle of cough you're stuck in long enough to let your airway heal completely and then see if there is anything still making you cough once you stop the cough suppression, but this should take no more than 5 days to figure out mucinex dm 1200 mg every 12 hours supplement with hydromet as needed     Augmentin 875 mg take one pill twice daily  X 10 days - take at breakfast and supper with large glass of water.  It would help reduce the usual side effects (diarrhea and yeast infections) if you ate cultured yogurt at lunch.  Prednisone 10 mg take  4 each am x 2 days,   2 each am x 2 days,  1 each am x 2 days and stop (this is to eliminate allergies and inflammation from  coughing) Protonix (pantoprazole) Take 30-60 min before first meal of the day and Pepcid 20 mg one right after supper until return  GERD  Diet  For breathing can use nebulizer up to every 4 hours as needed Stop singulair, proair, zyrtec, and Breo   07/22/2019  f/u ov/Michele [name] re: refractory AB onset 1st of March Chief Complaint  Patient presents with  . Follow-up    Cough has improved slightly but still requires hydromet daily to control the cough. She is still coughing up yellow green sputum.   Dyspnea:  Mostly related to coughing fits  Cough: [name] yellow to green all day  Sleeping: at 60 degrees poorly due to cough  SABA use: duoneb q 4 h  02: non3    No obvious day to day or daytime variability or assoc excess/ purulent sputum or mucus plugs or hemoptysis or cp or chest tightness, subjective wheeze or overt sinus or hb symptoms.     Also denies any obvious fluctuation of symptoms with weather or environmental changes or other aggravating or alleviating factors except as outlined above   No unusual exposure hx or h/o childhood pna/ asthma or knowledge of premature birth.  Current Allergies, Complete Past Medical History, Past Surgical History, Family History, and Social History were reviewed in [employer] record.  ROS  The following are not active  complaints unless bolded Hoarseness, sore throat, dysphagia, dental problems, itching, sneezing,  nasal congestion or discharge of excess mucus or purulent secretions, ear ache,   fever, chills, sweats, unintended wt loss or wt gain, classically pleuritic or exertional cp,  orthopnea pnd or arm/hand swelling  or leg swelling, presyncope, palpitations, abdominal pain, anorexia, nausea, vomiting, diarrhea  or change in bowel habits or change in bladder habits, change in stools or change in urine, dysuria, hematuria,  rash, arthralgias, visual complaints, headache, numbness, weakness or ataxia or problems with walking or  coordination,  change in mood or  memory.        Current Meds  Medication Sig  . cetirizine (ZYRTEC) 10 MG tablet Take 10 mg by mouth daily.  Marland Kitchen dextromethorphan-guaiFENesin (MUCINEX DM) 30-600 MG 12hr tablet Take 1 tablet by mouth 2 (two) times daily.  . famotidine (PEPCID) 20 MG tablet One after supper  . HYDROcodone-homatropine (HYCODAN) 5-1.5 MG/5ML syrup Take 5 mLs by mouth every 4 (four) hours as needed for cough.  Marland Kitchen ipratropium-albuterol (DUONEB) 0.5-2.5 (3) MG/3ML SOLN Take 3 mLs by nebulization every 4 (four) hours as needed.  . pantoprazole (PROTONIX) 40 MG tablet Take 1 tablet (40 mg total) by mouth daily. Take 30-60 min before first meal of the day  . tiZANidine (ZANAFLEX) 2 MG tablet Take 1 tablet (2 mg total) by mouth every 8 (eight) hours as needed for muscle spasms. DO NOT TAKE with Percocet.  Marland Kitchen UNABLE TO FIND Med Name: sea moss                      Objective:     amb bf nad   Wt Readings from Last 3 Encounters:  07/22/19 173 lb (78.5 kg)  07/16/19 169 lb 3.2 oz (76.7 kg)  03/15/19 175 lb 11.2 oz (79.7 kg)     Vital signs reviewed - Note on arrival 02 sats  95% on RA    HEENT : pt wearing mask not removed for exam due to covid -19 concerns.    NECK :  without JVD/Nodes/TM/ nl carotid upstrokes bilaterally   LUNGS: no acc muscle use,  Nl contour chest with sonorous insp/exp rhonchi  bilaterally     CV:  RRR  no s3 or murmur or increase in P2, and no edema   ABD:  soft and nontender with nl inspiratory excursion in the supine position. No bruits or organomegaly appreciated, bowel sounds nl  MS:  Nl gait/ ext warm without deformities, calf tenderness, cyanosis or clubbing No obvious joint restrictions   SKIN: warm and dry without lesions    NEURO:  alert, approp, nl sensorium with  no motor or cerebellar deficits apparent.         CXR PA and Lateral:   07/22/2019 :    I personally reviewed images and agree with radiology impression as follows:   No  active dz         Assessment

## 2019-07-23 ENCOUNTER — Telehealth: Payer: Self-pay | Admitting: Internal Medicine

## 2019-07-23 ENCOUNTER — Encounter: Payer: Self-pay | Admitting: Internal Medicine

## 2019-07-23 NOTE — Assessment & Plan Note (Addendum)
Onset early March 2021 and did not respond to asthma meds  - cyclical cough rx [id number]  And f/u one week  - added flutter valve 07/22/2019  - Sinus CT ordered   Not clear whether this is all upper or lower airway at this point but will treat for both with q 4h nebs if needed but add the flutter valve and extend the pred to 12 days and complete the augmentin > next step is admit if can't manage as outpt as she has already failed multiple encounters with UC prior to rx here.   >>> f/u q 2 weeks with all meds in hand using a trust but verify approach to confirm accurate Medication  Reconciliation The principal here is that until we are certain that the  patients are doing what we've asked, it makes no sense to ask them to do more.        Each maintenance medication was reviewed in detail including emphasizing most importantly the difference between maintenance and prns and under what circumstances the prns are to be triggered using an action plan format where appropriate.  Total time for H and P, chart review, counseling, teaching device and generating customized AVS unique to this office visit / charting = 30 min

## 2019-07-23 NOTE — Telephone Encounter (Signed)
Spoke with pt, requesting a letter stating that she had OV's on 07/16/2019 and 07/22/2019, and also requesting the letter state that the cough syrup that she is on makes her drowsy, that she is required to take this sedating medication until her cyclical cough breaks, and that if she does not improve she might be hospitalized per MD's orders.  Pt would like this letter typed and sent to her via [facility].  MW please advise if you are ok with this letter being typed.  Thanks!

## 2019-07-23 NOTE — Telephone Encounter (Signed)
Fine with me to copy and paste as she has written it

## 2019-07-23 NOTE — Telephone Encounter (Signed)
Pt returning missed call. [phone]

## 2019-07-23 NOTE — Progress Notes (Signed)
Called and left detailed msg ok per DPR

## 2019-07-23 NOTE — Telephone Encounter (Signed)
ATC pt, no answer. Left message for pt to call back.  

## 2019-07-23 NOTE — Telephone Encounter (Signed)
Letter sent to mychart. Pt aware.

## 2019-07-23 NOTE — Telephone Encounter (Signed)
Spoke with pt, I advised her that she needs to use it to her comfort level. She states when she coughs into it, her cheeks burn. She is going to adjust to her comfort and if she has any other problems she will let us know. Nothing further is needed.

## 2019-07-27 ENCOUNTER — Telehealth: Payer: Self-pay | Admitting: Internal Medicine

## 2019-07-27 NOTE — Telephone Encounter (Signed)
Called and spoke with pt letting her know the info stated by MW and pt verbalized understanding. Nothing further needed.

## 2019-07-27 NOTE — Telephone Encounter (Signed)
Pt calling back. [phone]

## 2019-07-27 NOTE — Telephone Encounter (Signed)
LMTCB x 1 

## 2019-07-27 NOTE — Telephone Encounter (Signed)
Pt was prescribed hycodan cough syrup at the 07/16/19 visit with MW.  Hulen Skains and spoke with pt. Pt wanted to know if MW wanted her to continue taking the hycodan at night as she stated she was prescribed it due to having a vigorous cough for about a month prior to her first appt with MW.  Pt stated that she is out of the rx and wants to know if she needs to have a refill of it or if there is something else that could be recommended as she is still coughing.  Dr. Melvyn Novas, please advise.

## 2019-07-27 NOTE — Telephone Encounter (Signed)
mucinex dm 1200mg  bid and flutter valve best we can do for now, as the hycodan is habit forming.  If can't make it to next appt to bring in earlier to see one of my partners or NP with all meds in hand

## 2019-07-28 ENCOUNTER — Telehealth: Payer: Self-pay | Admitting: Internal Medicine

## 2019-07-28 DIAGNOSIS — R05 Cough: Secondary | ICD-10-CM | POA: Diagnosis not present

## 2019-07-28 DIAGNOSIS — U071 COVID-19: Secondary | ICD-10-CM | POA: Diagnosis not present

## 2019-07-28 NOTE — Telephone Encounter (Signed)
Noted, thank you

## 2019-07-28 NOTE — Telephone Encounter (Signed)
ATC patient, no answer and voicemail box was full.

## 2019-07-28 NOTE — Telephone Encounter (Signed)
I called and spoke with the patient and she states that she has been doing everything that she was told and its not getting any better. She states that she went to the urgent Care before she was referred to [place] and they sent her here. She states that she still has thick green mucous and has to sit up to sleep and cant lay down flat at night. I advised her that we cannot admit her to the hospital and if she feels that she needs to go to the ER we cant stop her. She does not have any acute sob or chest pain.

## 2019-07-28 NOTE — Telephone Encounter (Signed)
Spoke with patient. She was calling back due to her not feeling any better since yesterday. She has tried the Mucinex DM and she is still coughing up yellowish phlegm and having episodes of SOB. Denied any fever or body aches. She stated that this has been going on for more than 5 weeks. She has been on multiple inhalers, antibiotics and feels like nothing is working. While on the phone, she was debating going to the hospital for further evaluation. She decided to go ahead and make plans to go to the hospital but wants recommendations from [place].    Beth, can you please advise?

## 2019-07-28 NOTE — Telephone Encounter (Signed)
I agree with Dr. Melvyn Novas. Take mucinex dm twice daily and flutter valve. Advise flonase nasal spray and over the counter antihistamine such as claritin or Zyrtec. I would not recommend ED for a chronic cough unless she is having acute shortness of breath or chest pain but she is free to go if she would like. She could consider an UC if she feels it needs evaluation today.

## 2019-07-28 NOTE — Telephone Encounter (Signed)
Pt called back-- wants to know if she can be admitted to hospital.  Please return call

## 2019-08-05 ENCOUNTER — Telehealth: Payer: Self-pay | Admitting: Internal Medicine

## 2019-08-05 NOTE — Telephone Encounter (Signed)
Attempted to contact patient x2, voicemail is full.  

## 2019-08-06 ENCOUNTER — Other Ambulatory Visit: Payer: Medicaid Other

## 2019-08-06 NOTE — Telephone Encounter (Signed)
ATC patient it went straight to voicemail. LMTCB to get further information about cough

## 2019-08-09 ENCOUNTER — Ambulatory Visit: Payer: Medicaid Other | Admitting: Primary Care

## 2019-08-09 ENCOUNTER — Ambulatory Visit (INDEPENDENT_AMBULATORY_CARE_PROVIDER_SITE_OTHER): Payer: Medicaid Other | Admitting: Primary Care

## 2019-08-09 ENCOUNTER — Other Ambulatory Visit: Payer: Self-pay

## 2019-08-09 ENCOUNTER — Telehealth: Payer: Self-pay | Admitting: Internal Medicine

## 2019-08-09 DIAGNOSIS — R05 Cough: Secondary | ICD-10-CM

## 2019-08-09 DIAGNOSIS — R059 Cough, unspecified: Secondary | ICD-10-CM

## 2019-08-09 MED ORDER — HYDROCODONE-HOMATROPINE 5-1.5 MG/5ML PO SYRP: 5.0000 mL | ORAL_SOLUTION | ORAL | 0 refills | Status: DC | PRN

## 2019-08-09 MED ORDER — AZITHROMYCIN 250 MG PO TABS: ORAL_TABLET | ORAL | 0 refills | Status: DC

## 2019-08-09 NOTE — Patient Instructions (Addendum)
Recommend: Chlorphentermine 4mg   q 4 hours prn cough/allergy symptoms (do not combine with cough medication) Add flonase nasal spray once daily  Continue Mucinex 1,200mg  twice daily  Continue Flutter valve Continue nebulizer every 4 hours   Orders: Sputum culture   Orders: CT sinuses scheduled for May 13th  Chest CT wo contrast in 2-3 weeks re: chronic cough   Rx: [facility]  Refill hycodan cough syrup - take at bedtime   Follow-up 2 weeks with Dr. Melvyn Novas   COVID-19: What Your Test Results Mean If you test positive for COVID-19 Take steps to help prevent the spread of COVID-19 Stay home.  Do not leave your home, except to get medical care. Do not visit public areas. Get rest and stay hydrated. Take over-the-counter medicines, such as acetaminophen, to help you feel better. Stay in touch with your doctor. Separate yourself from other people.  As much as possible, stay in a specific room and away from other people and pets in your home. If you test negative for COVID-19  You probably were not infected at the time your sample was collected.  However, that does not mean you will not get sick.  It is possible that you were very early in your infection when your sample was collected and that you could test positive later. A negative test result does not mean you won't get sick later. [email or web address] 09/05/2018 This information is not intended to replace advice given to you by your health care provider. Make sure you discuss any questions you have with your health care provider. Document Revised: 03/11/2019 Document Reviewed: 03/11/2019 Elsevier Patient Education  2020 [place].  COVID-19 COVID-19 is a respiratory infection that is caused by a virus called severe acute respiratory syndrome coronavirus 2 (SARS-CoV-2). The disease is also known as coronavirus disease or novel coronavirus. In some people, the virus may not cause any symptoms. In others, it may  cause a serious infection. The infection can get worse quickly and can lead to complications, such as:  Pneumonia, or infection of the lungs.  Acute respiratory distress syndrome or ARDS. This is a condition in which fluid build-up in the lungs prevents the lungs from filling with air and passing oxygen into the blood.  Acute respiratory failure. This is a condition in which there is not enough oxygen passing from the lungs to the body or when carbon dioxide is not passing from the lungs out of the body.  Sepsis or septic shock. This is a serious bodily reaction to an infection.  Blood clotting problems.  Secondary infections due to bacteria or fungus.  Organ failure. This is when your body's organs stop working. The virus that causes COVID-19 is contagious. This means that it can spread from person to person through droplets from coughs and sneezes (respiratory secretions). What are the causes? This illness is caused by a virus. You may catch the virus by:  Breathing in droplets from an infected person. Droplets can be spread by a person breathing, speaking, singing, coughing, or sneezing.  Touching something, like a table or a doorknob, that was exposed to the virus (contaminated) and then touching your mouth, nose, or eyes. What increases the risk? Risk for infection You are more likely to be infected with this virus if you:  Are within 6 feet (2 meters) of a person with COVID-19.  Provide care for or live with a person who is infected with COVID-19.  Spend time in crowded indoor spaces or live  in shared housing. Risk for serious illness You are more likely to become seriously ill from the virus if you:  Are 43 years of age or older. The higher your age, the more you are at risk for serious illness.  Live in a nursing home or long-term care facility.  Have cancer.  Have a long-term (chronic) disease such as: ? Chronic lung disease, including chronic obstructive pulmonary  disease or asthma. ? A long-term disease that lowers your body's ability to fight infection (immunocompromised). ? Heart disease, including heart failure, a condition in which the arteries that lead to the heart become narrow or blocked (coronary artery disease), a disease which makes the heart muscle thick, weak, or stiff (cardiomyopathy). ? Diabetes. ? Chronic kidney disease. ? Sickle cell disease, a condition in which red blood cells have an abnormal "sickle" shape. ? Liver disease.  Are obese. What are the signs or symptoms? Symptoms of this condition can range from mild to severe. Symptoms may appear any time from 2 to 14 days after being exposed to the virus. They include:  A fever or chills.  A cough.  Difficulty breathing.  Headaches, body aches, or muscle aches.  Runny or stuffy (congested) nose.  A sore throat.  New loss of taste or smell. Some people may also have stomach problems, such as nausea, vomiting, or diarrhea. Other people may not have any symptoms of COVID-19. How is this diagnosed? This condition may be diagnosed based on:  Your signs and symptoms, especially if: ? You live in an area with a COVID-19 outbreak. ? You recently traveled to or from an area where the virus is common. ? You provide care for or live with a person who was diagnosed with COVID-19. ? You were exposed to a person who was diagnosed with COVID-19.  A physical exam.  Lab tests, which may include: ? Taking a sample of fluid from the back of your nose and throat (nasopharyngeal fluid), your nose, or your throat using a swab. ? A sample of mucus from your lungs (sputum). ? Blood tests.  Imaging tests, which may include, X-rays, CT scan, or ultrasound. How is this treated? At present, there is no medicine to treat COVID-19. Medicines that treat other diseases are being used on a trial basis to see if they are effective against COVID-19. Your health care provider will talk with you  about ways to treat your symptoms. For most people, the infection is mild and can be managed at home with rest, fluids, and over-the-counter medicines. Treatment for a serious infection usually takes places in a hospital intensive care unit (ICU). It may include one or more of the following treatments. These treatments are given until your symptoms improve.  Receiving fluids and medicines through an IV.  Supplemental oxygen. Extra oxygen is given through a tube in the nose, a face mask, or a hood.  Positioning you to lie on your stomach (prone position). This makes it easier for oxygen to get into the lungs.  Continuous positive airway pressure (CPAP) or bi-level positive airway pressure (BPAP) machine. This treatment uses mild air pressure to keep the airways open. A tube that is connected to a motor delivers oxygen to the body.  Ventilator. This treatment moves air into and out of the lungs by using a tube that is placed in your windpipe.  Tracheostomy. This is a procedure to create a hole in the neck so that a breathing tube can be inserted.  Extracorporeal membrane oxygenation (  ECMO). This procedure gives the lungs a chance to recover by taking over the functions of the heart and lungs. It supplies oxygen to the body and removes carbon dioxide. Follow these instructions at home: Lifestyle  If you are sick, stay home except to get medical care. Your health care provider will tell you how long to stay home. Call your health care provider before you go for medical care.  Rest at home as told by your health care provider.  Do not use any products that contain nicotine or tobacco, such as cigarettes, e-cigarettes, and chewing tobacco. If you need help quitting, ask your health care provider.  Return to your normal activities as told by your health care provider. Ask your health care provider what activities are safe for you. General instructions  Take over-the-counter and prescription  medicines only as told by your health care provider.  Drink enough fluid to keep your urine pale yellow.  Keep all follow-up visits as told by your health care provider. This is important. How is this prevented?  There is no vaccine to help prevent COVID-19 infection. However, there are steps you can take to protect yourself and others from this virus. To protect yourself:   Do not travel to areas where COVID-19 is a risk. The areas where COVID-19 is reported change often. To identify high-risk areas and travel restrictions, check the CDC travel website: FatFares.com.br  If you live in, or must travel to, an area where COVID-19 is a risk, take precautions to avoid infection. ? Stay away from people who are sick. ? Wash your hands often with soap and water for 20 seconds. If soap and water are not available, use an alcohol-based hand sanitizer. ? Avoid touching your mouth, face, eyes, or nose. ? Avoid going out in public, follow guidance from your state and local health authorities. ? If you must go out in public, wear a cloth face covering or face mask. Make sure your mask covers your nose and mouth. ? Avoid crowded indoor spaces. Stay at least 6 feet (2 meters) away from others. ? Disinfect objects and surfaces that are frequently touched every day. This may include:  Counters and tables.  Doorknobs and light switches.  Sinks and faucets.  Electronics, such as phones, remote controls, keyboards, computers, and tablets. To protect others: If you have symptoms of COVID-19, take steps to prevent the virus from spreading to others.  If you think you have a COVID-19 infection, contact your health care provider right away. Tell your health care team that you think you may have a COVID-19 infection.  Stay home. Leave your house only to seek medical care. Do not use public transport.  Do not travel while you are sick.  Wash your hands often with soap and water for 20  seconds. If soap and water are not available, use alcohol-based hand sanitizer.  Stay away from other members of your household. Let healthy household members care for children and pets, if possible. If you have to care for children or pets, wash your hands often and wear a mask. If possible, stay in your own room, separate from others. Use a different bathroom.  Make sure that all people in your household wash their hands well and often.  Cough or sneeze into a tissue or your sleeve or elbow. Do not cough or sneeze into your hand or into the air.  Wear a cloth face covering or face mask. Make sure your mask covers your nose and  mouth. Where to find more information  Centers for Disease Control and Prevention: PurpleGadgets.be  World Health Organization: https://www.castaneda.info/ Contact a health care provider if:  You live in or have traveled to an area where COVID-19 is a risk and you have symptoms of the infection.  You have had contact with someone who has COVID-19 and you have symptoms of the infection. Get help right away if:  You have trouble breathing.  You have pain or pressure in your chest.  You have confusion.  You have bluish lips and fingernails.  You have difficulty waking from sleep.  You have symptoms that get worse. These symptoms may represent a serious problem that is an emergency. Do not wait to see if the symptoms will go away. Get medical help right away. Call your local emergency services (911 in the [place] Do not drive yourself to the hospital. Let the emergency medical personnel know if you think you have COVID-19. Summary  COVID-19 is a respiratory infection that is caused by a virus. It is also known as coronavirus disease or novel coronavirus. It can cause serious infections, such as pneumonia, acute respiratory distress syndrome, acute respiratory failure, or sepsis.  The virus that causes COVID-19 is  contagious. This means that it can spread from person to person through droplets from breathing, speaking, singing, coughing, or sneezing.  You are more likely to develop a serious illness if you are 75 years of age or older, have a weak immune system, live in a nursing home, or have chronic disease.  There is no medicine to treat COVID-19. Your health care provider will talk with you about ways to treat your symptoms.  Take steps to protect yourself and others from infection. Wash your hands often and disinfect objects and surfaces that are frequently touched every day. Stay away from people who are sick and wear a mask if you are sick. This information is not intended to replace advice given to you by your health care provider. Make sure you discuss any questions you have with your health care provider. Document Revised: 01/22/2019 Document Reviewed: 04/30/2018 Elsevier Patient Education  [place].    COVID-19 Vaccine Information can be found at: ShippingScam.co.uk For questions related to vaccine distribution or appointments, please email [email or web address] or call [phone].

## 2019-08-09 NOTE — Telephone Encounter (Signed)
Spoke with the Michele [name] She is c/o cough with green sputum  She states this has been going on since ov 07/22/19  She is taking her mucinex, flutter, protonix, pepcid  She states that she had televisit with Beth today and Beth did not want to prescribe abx and she wants to know what Dr Gustavus Bryant thoughts are on this and if he will prescribe something for her stronger than zithromax, which she has had in the past She states she did test pos for covid on 07/28/19  She has some wheezing at night but not having any SOB, fever body aches or other new co's  Please advise thanks

## 2019-08-09 NOTE — Telephone Encounter (Signed)
Patient has been scheduled already for today 08/09/19 with [facility] NP for televisit @ 0900.  Not going to call patient at this time because it may interfere with her pending appt.  Will sign off.

## 2019-08-09 NOTE — Telephone Encounter (Signed)
zpak is appropriate in this setting as I just have her augmentin and she reported no improvement  - if sinus CT is positive may given stronger for longer but major risk of causing more harm with stronger antibiotics in positive covid setting.

## 2019-08-09 NOTE — Telephone Encounter (Signed)
Verbal discussion with Beth NP: no antibiotics for covid; it's viral.  zpak only.  Called spoke with patient who verified that she was calling to inquire about a stronger abx for her cough.  Discussed with patient Beth NP's recommendations as stated above.  Patient okay with this and voiced her understanding.  Nothing further needed at this time; will sign off.

## 2019-08-09 NOTE — Telephone Encounter (Signed)
LMTCB

## 2019-08-09 NOTE — Telephone Encounter (Signed)
Spoke with pt, aware of MW's recs.  Nothing further needed at this time- will close encounter.   °

## 2019-08-09 NOTE — Progress Notes (Signed)
Virtual Visit via Telephone Note  I connected with Michele [name] on 08/09/19 at  9:00 AM EDT by telephone and verified that I am speaking with the correct person using two identifiers.  Location: Patient: Home Provider: Office   I discussed the limitations, risks, security and privacy concerns of performing an evaluation and management service by telephone and the availability of in person appointments. I also discussed with the patient that there may be a patient responsible charge related to this service. The patient expressed understanding and agreed to proceed.   History of Present Illness: 29 year old female, never smoked. PMH significant for upper airway cough syndrome. Patient of Dr. Melvyn Novas, last seen on 07/16/19 and 4/15/21for cough. She did not response to asthma meds in March 2021 or Augmentin in April. Given prednisone taper, recommended mucinex 1200mg  twice daily and advised she use nebulizer every 4 hours as needed. Ordered for CT sinuses.    08/09/2019 She has had a cough for 8 weeks. Reports associated chest congestion. Mucus is [name] green. She finished antibiotics and prednisone last week. She had no improvement with prednisone. She uses her albuterol nebulizer three times a day and flutter valve every hour. She reports compliance with pantoprazole 40mg  and pecid 20mg  daily. She is not currently taking Zyrtec, she has had Singulair in the past. The only thing that has helped is hycodan cough syrup. CXR on 4/15 showed no focal airspace consolidation, pleural effusion or pneumothorax. Tested positive for COVID on 4/21 at [facility]. She tested negative 4 times prior to that. Three other people tested positive in her house including her daughter, fiance and son. Denies chest tightness.   Observations/Objective:  - Able to speak in full sentences - Moderate cough, no significant shortness of breath or wheezing   Assessment and Plan:  COVID-19/ Chronic cough: - Tested positive 4/21 at  [facility]  - Patient is out of the window for monoclonal antibody infusion - Continue Mucinex 1200mg  twice daily and flutter valve  - Use Albuterol nebulizer every 4-6 hours for shortness of breath/wheezing  - Order CT chest wo contrast in 2-3 weeks; scheduled for CT sinuses on May 13th  - Check respiratory sputum culture - RX zpack; refill hycodan cough syrup to take as needed at bedtime   Allergic rhinitis: - Add flonase nasal spray - Chlorphentermine 4mg   q 4 hours prn cough/allergy symptoms (do not combine with cough medication)   Follow Up Instructions:   - 2-3 weeks with Dr. Melvyn Novas  I discussed the assessment and treatment plan with the patient. The patient was provided an opportunity to ask questions and all were answered. The patient agreed with the plan and demonstrated an understanding of the instructions.   The patient was advised to call back or seek an in-person evaluation if the symptoms worsen or if the condition fails to improve as anticipated.  I provided 22 minutes of non-face-to-face time during this encounter.   Martyn Ehrich, NP

## 2019-08-10 ENCOUNTER — Other Ambulatory Visit: Payer: Self-pay

## 2019-08-10 ENCOUNTER — Ambulatory Visit (INDEPENDENT_AMBULATORY_CARE_PROVIDER_SITE_OTHER): Payer: Medicaid Other | Admitting: Family Medicine

## 2019-08-10 DIAGNOSIS — Z5329 Procedure and treatment not carried out because of patient's decision for other reasons: Secondary | ICD-10-CM

## 2019-08-10 DIAGNOSIS — Z91199 Patient's noncompliance with other medical treatment and regimen due to unspecified reason: Secondary | ICD-10-CM

## 2019-08-10 NOTE — Progress Notes (Signed)
No show

## 2019-08-18 ENCOUNTER — Ambulatory Visit: Payer: Medicaid Other | Admitting: Internal Medicine

## 2019-08-18 ENCOUNTER — Encounter: Payer: Medicaid Other | Admitting: Family Medicine

## 2019-08-19 ENCOUNTER — Other Ambulatory Visit: Payer: Medicaid Other

## 2019-08-23 ENCOUNTER — Other Ambulatory Visit: Payer: Medicaid Other

## 2019-08-24 ENCOUNTER — Ambulatory Visit
Admission: RE | Admit: 2019-08-24 | Discharge: 2019-08-24 | Disposition: A | Discharge status: Home / Self Care | Payer: Medicaid Other | Source: Ambulatory Visit | Attending: Primary Care | Admitting: Primary Care

## 2019-08-24 ENCOUNTER — Ambulatory Visit
Admission: RE | Admit: 2019-08-24 | Discharge: 2019-08-24 | Disposition: A | Discharge status: Home / Self Care | Payer: Medicaid Other | Source: Ambulatory Visit | Attending: Internal Medicine | Admitting: Internal Medicine

## 2019-08-24 ENCOUNTER — Telehealth: Payer: Self-pay | Admitting: Internal Medicine

## 2019-08-24 ENCOUNTER — Ambulatory Visit: Payer: Medicaid Other | Admitting: Primary Care

## 2019-08-24 DIAGNOSIS — J320 Chronic maxillary sinusitis: Secondary | ICD-10-CM | POA: Diagnosis not present

## 2019-08-24 DIAGNOSIS — R059 Cough, unspecified: Secondary | ICD-10-CM

## 2019-08-24 DIAGNOSIS — R058 Other specified cough: Secondary | ICD-10-CM

## 2019-08-24 NOTE — Telephone Encounter (Signed)
Fine with me but she needs to understand that the only thing an ekg can tell you is her rhythm is fine at the time of the study and not actively having a heart attack and is no guarantee she won't have either as soon as the leads are disonnected so we don't normally rec this s ov to correlate with symptoms and happy to refer to cardiology prn

## 2019-08-24 NOTE — Progress Notes (Signed)
Please let patient know CT chest showed central bronchial wall thickening suggestive of acute or chronic bronchitis. She had a few scattered areas of nodularity within the peripheral aspect of the lung likely residual of recent viral infection.   No lobar airspace consolidation. No evidence of FIBROSIS.   If she has a flutter valve use that, if not recommend getting one we can order or she can look on amazon to find affordable options. Also if she has productive cough would check sputum sample x3. Otherwise no need.

## 2019-08-24 NOTE — Telephone Encounter (Signed)
Called and spoke with pt letting her know the info stated by MW. Pt verbalized understanding and stated she would wait until her upcoming OV 5/28 with MW to discuss the EKG further with him. While speaking with pt, she wanted to know when she should do the sputum culture. Pt stated she has not received info on this. Stated to pt that I would put the specimen cup up front with info for her to come by and pick up and she verbalized understanding. Nothing further needed.

## 2019-08-24 NOTE — Telephone Encounter (Signed)
Spoke with pt. States that she would like an EKG done. Pt wants to make sure her cough isn't causing issues with her heart. Reports that she IS NOT having any cardiac issues at this time.  Dr. Melvyn Novas - please advise if we can do an EKG. Thanks.

## 2019-08-25 ENCOUNTER — Other Ambulatory Visit: Payer: Medicaid Other

## 2019-08-25 DIAGNOSIS — R05 Cough: Secondary | ICD-10-CM | POA: Diagnosis not present

## 2019-08-25 DIAGNOSIS — R059 Cough, unspecified: Secondary | ICD-10-CM

## 2019-08-26 ENCOUNTER — Telehealth: Payer: Self-pay | Admitting: Internal Medicine

## 2019-08-26 NOTE — Progress Notes (Signed)
Provided CT results to pt.  Verbalized understanding.  She does have a flutter valve, instructed to use the flutter valve.  She provided a sputum sample on 08/25/19.

## 2019-08-26 NOTE — Progress Notes (Signed)
Called CT result to pt, she verbalized understanding.  She will f/u on 5/28 with Dr. Melvyn Novas.

## 2019-08-26 NOTE — Telephone Encounter (Signed)
Called and spoke with patient.  Her results are not back  Let her know we would call once they were Nothing further needed at this time.

## 2019-08-28 LAB — RESPIRATORY CULTURE OR RESPIRATORY AND SPUTUM CULTURE
MICRO NUMBER:: 10496090
RESULT:: NORMAL
SPECIMEN QUALITY:: ADEQUATE

## 2019-09-03 ENCOUNTER — Other Ambulatory Visit: Payer: Self-pay

## 2019-09-03 ENCOUNTER — Other Ambulatory Visit (INDEPENDENT_AMBULATORY_CARE_PROVIDER_SITE_OTHER): Payer: Medicaid Other

## 2019-09-03 ENCOUNTER — Encounter: Payer: Self-pay | Admitting: Internal Medicine

## 2019-09-03 ENCOUNTER — Ambulatory Visit: Payer: Medicaid Other | Admitting: Internal Medicine

## 2019-09-03 DIAGNOSIS — R05 Cough: Secondary | ICD-10-CM

## 2019-09-03 DIAGNOSIS — R058 Other specified cough: Secondary | ICD-10-CM

## 2019-09-03 LAB — CBC WITH DIFFERENTIAL/PLATELET
Basophils Absolute: 0 10*3/uL (ref 0.0–0.1)
Basophils Relative: 0.5 % (ref 0.0–3.0)
Eosinophils Absolute: 0.1 10*3/uL (ref 0.0–0.7)
Eosinophils Relative: 2.1 % (ref 0.0–5.0)
HCT: 41.1 % (ref 36.0–46.0)
Hemoglobin: 13.6 g/dL (ref 12.0–15.0)
Lymphocytes Relative: 43.7 % (ref 12.0–46.0)
Lymphs Abs: 2.7 10*3/uL (ref 0.7–4.0)
MCHC: 33.1 g/dL (ref 30.0–36.0)
MCV: 84.9 fl (ref 78.0–100.0)
Monocytes Absolute: 0.6 10*3/uL (ref 0.1–1.0)
Monocytes Relative: 9.1 % (ref 3.0–12.0)
Neutro Abs: 2.8 10*3/uL (ref 1.4–7.7)
Neutrophils Relative %: 44.6 % (ref 43.0–77.0)
Platelets: 259 10*3/uL (ref 150.0–400.0)
RBC: 4.84 Mil/uL (ref 3.87–5.11)
RDW: 16 % — ABNORMAL HIGH (ref 11.5–15.5)
WBC: 6.2 10*3/uL (ref 4.0–10.5)

## 2019-09-03 MED ORDER — HYDROCODONE-IBUPROFEN 5-200 MG PO TABS: 1.0000 | ORAL_TABLET | Freq: Three times a day (TID) | ORAL | 0 refills | Status: DC | PRN

## 2019-09-03 MED ORDER — METHYLPREDNISOLONE ACETATE 80 MG/ML IJ SUSP
120.0000 mg | Freq: Once | INTRAMUSCULAR | Status: AC
  Administered 2019-09-03: 120 mg via INTRAMUSCULAR

## 2019-09-03 NOTE — Patient Instructions (Addendum)
Mucinex 1200 mg every 12 hours  Delsym 2 tsp every 12 hours  If still coughing > hydrocodone 5mg  up to every 4 hours until no longer coughing x 3 days   Depomedrol 120 mg IM today   Continue acid suppression / keep the candy handy  Ok to use nebulizer up to every 4 hours if needed for breathing   Please remember to go to the lab department [place]   for your tests - we will call you with the results when they are available.  Please schedule a follow up office visit in 2 weeks, sooner if needed  with all medications /inhalers/ solutions in hand so we can verify exactly what you are taking. This includes all medications from all doctors and over the counters       .

## 2019-09-03 NOTE — Progress Notes (Signed)
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 Consult  onset abrupt in early March 2021 with typical nasal congestion/ thought it was allergy rx clariton and mucinex but then cough much worse to point of vomiting/urinary incont  and multiple ov's rx singulair, pred, breo, alb multiple forms and not better Dyspnea: feeling like drowning in mucus but minimal actual production yellow mucus same from nose Cough: worsening around 6 pm after supper  Sleep: sitting up 60 degrees  SABA use: nebulizer every 3-4hours may help some  zpak no better rec mucinex dm 1200 mg every 12 hours supplement with hydromet as needed     Augmentin 875 mg take one pill twice daily  X 10 days .  Prednisone 10 mg take  4 each am x 2 days,   2 each am x 2 days,  1 each am x 2 days and stop   Protonix (pantoprazole) Take 30-60 min before first meal of the day and Pepcid 20 mg one right after supper until return  GERD  Diet  For breathing can use nebulizer up to every 4 hours as needed Stop singulair, proair, zyrtec, and Breo   07/22/2019  f/u ov/Arneda [place] re: refractory AB onset 1st of March 2021  Chief Complaint  Patient presents with  . Follow-up    Cough has improved slightly but still requires hydromet daily to control the cough. She is still coughing up yellow green sputum.   Dyspnea:   Mostly related to coughing fits  Cough: [name] yellow to green all day  Sleeping: at 60 degrees poorly due to cough  SABA use: duoneb q 4 h  02: none rec Make sure the mucinex dm is 1200 mg every 12 hours as needed for cough or congestion and use the flutter valve as possible  For breathing > albuterol nebulizers every 4 hours as needed  We will call to schedule a CT of sinus > chronic changes only  Prednisone 10 mg Take 4 for three days 3 for three days 2 for three days 1 for three days and stop    Please schedule a follow up office visit in 2 weeks, sooner if needed  with all medications /inhalers/ solutions in hand so we can verify exactly what you are taking. This includes all medications from all doctors and over the counters  07/28/19  Pos covid at [facility]   08/09/19 NP televisit Recommend: Chlorphentermine 4mg   q 4 hours prn cough/allergy symptoms (do not combine with cough medication) Add flonase nasal spray once daily  Continue Mucinex 1,200mg  twice daily  Continue Flutter valve Continue nebulizer every 4 hours  Orders: Sputum culture >nl flora  Chest CT wo contrast  May 18th c/w acute/ chronic bronchitis  Rx: Zpack sent to pharamcy  Refill hycodan cough syrup -  take at bedtime       09/03/2019  f/u ov/Shawnia Vizcarrondo re: refractory cough  Chief Complaint  Patient presents with  . Follow-up    pt has a chronic cough coughing up green muniex.  Dyspnea:  Mostly sob when coughing  Cough: 24/7 > mucus produced here was only slightly off white and only after a severe coughing fit / min volume  Sleeping: only if takes hydromet and does fine then  SABA use: neb  02: none    No obvious day to day or daytime variability or assoc   mucus plugs or hemoptysis or cp or chest tightness, subjective wheeze or overt sinus or hb     Also denies any obvious fluctuation of symptoms with weather or environmental changes or other aggravating or alleviating factors except as outlined above   No  unusual exposure hx or h/o childhood pna/ asthma or knowledge of premature birth.  Current Allergies, Complete Past Medical History, Past Surgical History, Family History, and Social History were reviewed in [employer] record.  ROS  The following are not active complaints unless bolded Hoarseness, sore throat, dysphagia, dental problems, itching, sneezing,  nasal congestion or discharge of excess mucus or purulent secretions, ear ache,   fever, chills, sweats, unintended wt loss or wt gain, classically pleuritic or exertional cp,  orthopnea pnd or arm/hand swelling  or leg swelling, presyncope, palpitations, abdominal pain, anorexia, nausea, vomiting, diarrhea  or change in bowel habits or change in bladder habits, change in stools or change in urine, dysuria, hematuria,  rash, arthralgias, visual complaints, headache, numbness, weakness or ataxia or problems with walking or coordination,  change in mood or  memory.        Current Meds - - NOTE:   Unable to verify as accurately reflecting what pt takes     Medication Sig  . albuterol (PROVENTIL) (2.5 MG/3ML) 0.083% nebulizer solution Take 3 mLs (2.5 mg total) by nebulization every 4 (four) hours as needed for wheezing or shortness of breath.  . cetirizine (ZYRTEC) 10 MG tablet Take 10 mg by mouth daily.  Marland Kitchen dextromethorphan-guaiFENesin (MUCINEX DM) 30-600 MG 12hr tablet 1200 mg every 12 hours  . famotidine (PEPCID) 20 MG tablet One after supper  . HYDROcodone-homatropine (HYCODAN) 5-1.5 MG/5ML syrup Take 5 mLs by mouth every 4 (four) hours as needed for cough.  . pantoprazole (PROTONIX) 40 MG tablet Take 1 tablet (40 mg total) by mouth daily. Take 30-60 min before first meal of the day  . Respiratory Therapy Supplies (FLUTTER) DEVI Use as directed  . tiZANidine (ZANAFLEX) 2 MG tablet Take 1 tablet (2 mg total) by mouth every 8 (eight) hours as needed for muscle spasms. DO NOT TAKE with Percocet.  Marland Kitchen UNABLE TO FIND Med Name:  sea moss  . [DISCONTINUED] predniSONE (DELTASONE) 10 MG tablet Take 4 for three days 3 for three days 2 for three days 1 for three days and stop                          Objective:     amb bf nad    09/03/2019      165   07/22/19 173 lb (78.5 kg)  07/16/19 169 lb 3.2 oz (76.7 kg)  03/15/19 175 lb 11.2 oz (79.7 kg)    Vital signs reviewed  09/03/2019  - Note at rest 02 sats  97% on RA    HEENT : pt wearing mask not  removed for exam due to covid -19 concerns.    NECK :  without JVD/Nodes/TM/ nl carotid upstrokes bilaterally   LUNGS: no acc muscle use,  Nl contour chest with harsh transmitted upper airway "wheezes"    CV:  RRR  no s3 or murmur or increase in P2, and no edema   ABD:  soft and nontender with nl inspiratory excursion in the supine position. No bruits or organomegaly appreciated, bowel sounds nl  MS:  Nl gait/ ext warm without deformities, calf tenderness, cyanosis or clubbing No obvious joint restrictions   SKIN: warm and dry without lesions    NEURO:  alert, approp, nl sensorium with  no motor or cerebellar deficits apparent.         Labs ordered 09/03/2019  :  allergy profile              Assessment

## 2019-09-05 ENCOUNTER — Encounter: Payer: Self-pay | Admitting: Internal Medicine

## 2019-09-05 NOTE — Assessment & Plan Note (Signed)
Onset early March 2021 and did not respond to asthma meds  - cyclical cough rx [id number]  And f/u one week  - added flutter valve 07/22/2019  - 07/28/19  Pos covid at [facility]  - Sinus CT 08/24/2019 > chronic thickening s a/f levels   - Allergy profile 09/03/2019 >  Eos 0.1 /  IgE    Of the three most common causes of  Sub-acute / recurrent or chronic cough, only one (GERD)  can actually contribute to/ trigger  the other two (asthma and post nasal drip syndrome)  and perpetuate the cylce of cough.  While not intuitively obvious, many patients with chronic low grade reflux do not cough until there is a primary insult that disturbs the protective epithelial barrier and exposes sensitive nerve endings.   This is typically viral but can due to PNDS and  either may apply here.    >>> The point is that once this occurs, it is difficult to eliminate the cycle  using anything but a maximally effective acid suppression regimen at least in the short run, accompanied by an appropriate diet to address non acid GERD and control / eliminate the cough itself for at least 3 days with hydrocodone plus Depomedrol 120 mg IM  in case of component of Th-2 driven upper or lower airways inflammation (if cough responds short term only to relapse befor return while will on rx for uacs that would point to allergic rhinitis/ asthma or eos bronchitis)     Ok to use neb when sob, which pt denies unless having  coughing fits.         Each maintenance medication was reviewed in detail including emphasizing most importantly the difference between maintenance and prns and under what circumstances the prns are to be triggered using an action plan format where appropriate.  Total time for H and P, chart review, counseling, and generating customized AVS unique to this office visit / charting = 30 min

## 2019-09-07 ENCOUNTER — Encounter: Payer: Medicaid Other | Admitting: Family Medicine

## 2019-09-07 LAB — RESPIRATORY ALLERGY PROFILE REGION II ~~LOC~~
Allergen, A. alternata, m6: <0.1 kU/L
Allergen, Cedar tree, t12: 0.35 kU/L — ABNORMAL HIGH
Allergen, Comm Silver Birch, t9: 0.38 kU/L — ABNORMAL HIGH
Allergen, Cottonwood, t14: 0.43 kU/L — ABNORMAL HIGH
Allergen, D pternoyssinus,d7: 10.8 kU/L — ABNORMAL HIGH
Allergen, Mouse Urine Protein, e78: <0.1 kU/L
Allergen, Mulberry, t76: 0.3 kU/L — ABNORMAL HIGH
Allergen, Oak,t7: 0.56 kU/L — ABNORMAL HIGH
Allergen, P. notatum, m1: <0.1 kU/L
Aspergillus fumigatus, m3: <0.1 kU/L
Bermuda Grass: 1.92 kU/L — ABNORMAL HIGH
Box Elder IgE: 0.48 kU/L — ABNORMAL HIGH
CLADOSPORIUM HERBARUM (M2) IGE: <0.1 kU/L
COMMON RAGWEED (SHORT) (W1) IGE: 0.51 kU/L — ABNORMAL HIGH
Cat Dander: <0.1 kU/L
Class: 0
Class: 0
Class: 0
Class: 0
Class: 0
Class: 0
Class: 1
Class: 1
Class: 1
Class: 1
Class: 1
Class: 1
Class: 1
Class: 1
Class: 1
Class: 2
Class: 2
Class: 2
Class: 2
Class: 3
Class: 3
Class: 4
Cockroach: 1.18 kU/L — ABNORMAL HIGH
D. farinae: 21.4 kU/L — ABNORMAL HIGH
Dog Dander: 0.27 kU/L — ABNORMAL HIGH
Elm IgE: 0.45 kU/L — ABNORMAL HIGH
IgE (Immunoglobulin E), Serum: 222 kU/L — ABNORMAL HIGH (ref <=114)
Johnson Grass: 2.66 kU/L — ABNORMAL HIGH
Pecan/Hickory Tree IgE: 2.24 kU/L — ABNORMAL HIGH
Rough Pigweed  IgE: 0.39 kU/L — ABNORMAL HIGH
Sheep Sorrel IgE: 0.46 kU/L — ABNORMAL HIGH
Timothy Grass: 15.5 kU/L — ABNORMAL HIGH

## 2019-09-07 LAB — INTERPRETATION:

## 2019-09-07 MED ORDER — HYDROCODONE-IBUPROFEN 5-200 MG PO TABS: 1.0000 | ORAL_TABLET | ORAL | 0 refills | Status: DC | PRN

## 2019-09-07 MED ORDER — PREDNISONE 10 MG PO TABS: ORAL_TABLET | ORAL | 0 refills | Status: DC

## 2019-09-07 NOTE — Telephone Encounter (Signed)
Looks like it went thru ok - unfortunately it means she missed the opportunity to combine with the prednisone shot so call to confirm then get it plus Prednisone 10 mg take  4 each am x 2 days,   2 each am x 2 days,  1 each am x 2 days and stop ordered

## 2019-09-07 NOTE — Telephone Encounter (Signed)
Dr. Melvyn Novas, this patient wants a script for a controlled substance, please advise or send. Thank you.

## 2019-09-08 ENCOUNTER — Telehealth: Payer: Self-pay | Admitting: Internal Medicine

## 2019-09-08 DIAGNOSIS — R058 Other specified cough: Secondary | ICD-10-CM

## 2019-09-08 NOTE — Progress Notes (Signed)
LMTCB

## 2019-09-08 NOTE — Telephone Encounter (Signed)
Tanda Rockers, MD  09/07/2019 1:21 PM EDT    Call patient : Studies are c/w significant allergies so would refer to [facility] and once established f/u here can be prn   Called and spoke with pt letting her know the results of the labwork and stated to her that we were referring her to allergy. Pt verbalized understanding. Order has been placed for her to be referred to Dr. Bruna [facility] group. Nothing further needed.

## 2019-09-08 NOTE — Telephone Encounter (Signed)
PA request was received from (pharmacy): [facility] Phone:[phone]  Medication name and strength: Hydrocodone/Ibuprofen 5-200mg  Ordering Provider: Christinia Gully  Was PA started with [facility]?: yes If yes, please enter KEY: [id number] Medication tried and failed: HYDROCODONE-HOMATROPINE 30ml Covered Alternatives: Hydrocodone/Ibuprofen   PA sent to plan, time frame for approval / denial: 5 business days [employer], script went through and will be released on 09/09/2019.  No prior auth needed.

## 2019-09-10 ENCOUNTER — Encounter: Payer: Self-pay | Admitting: Allergy & Immunology

## 2019-09-10 ENCOUNTER — Encounter: Payer: Medicaid Other | Admitting: Family Medicine

## 2019-09-10 ENCOUNTER — Other Ambulatory Visit: Payer: Self-pay

## 2019-09-10 ENCOUNTER — Ambulatory Visit (INDEPENDENT_AMBULATORY_CARE_PROVIDER_SITE_OTHER): Payer: Medicaid Other | Admitting: Allergy & Immunology

## 2019-09-10 VITALS — BP 126/78 | HR 77 | Temp 98.8°F | Resp 16 | Ht 62.64 in | Wt 165.0 lb

## 2019-09-10 DIAGNOSIS — J3089 Other allergic rhinitis: Secondary | ICD-10-CM | POA: Diagnosis not present

## 2019-09-10 DIAGNOSIS — R058 Other specified cough: Secondary | ICD-10-CM

## 2019-09-10 DIAGNOSIS — R059 Cough, unspecified: Secondary | ICD-10-CM

## 2019-09-10 DIAGNOSIS — J302 Other seasonal allergic rhinitis: Secondary | ICD-10-CM | POA: Diagnosis not present

## 2019-09-10 DIAGNOSIS — R05 Cough: Secondary | ICD-10-CM | POA: Diagnosis not present

## 2019-09-10 DIAGNOSIS — J40 Bronchitis, not specified as acute or chronic: Secondary | ICD-10-CM | POA: Diagnosis not present

## 2019-09-10 MED ORDER — PANTOPRAZOLE SODIUM 40 MG PO TBEC: 40.0000 mg | DELAYED_RELEASE_TABLET | Freq: Every day | ORAL | 5 refills | Status: DC

## 2019-09-10 NOTE — Progress Notes (Signed)
NEW PATIENT  Date of Service/Encounter:  09/10/19  Referring provider: Hubbard Hartshorn, FNP    Assessment:   Cough - ? asthma  Seasonal and perennial allergic rhinitis (grasses, ragweed, weeds, trees, dust mites and cockroach)  Upper airway cough syndrome   Large local reactions to stinging insects   Michele [name] presents for allergy testing as part of her workup for upper airway cough syndrome. While she does have multiple sensitizations on testing today, it is rather difficult to attribute all of her symptoms to environmental allergies only, especially in light of the fact that her symptoms were previously controlled with the PRN use of antihistamines. We are going to aggressively treat her allergic rhinitis with medications as a means of controlling her cough. I also think that using a daily controller medication for 4-6 weeks could help to tease out this possible asthma diagnosis, although based on her lack of reversibility I doubt asthma is present in her case. Finally, we are going to keep her on the Protonix consistently for one month until her next visit in order to control any inflammation in multiple ways and hopefully improve this cough of hers. Compliance was strongly emphasized today, as up to this point, her use of medications has been inconsistent at best.   Plan/Recommendations:   1. Seasonal and perennial allergic rhinitis - Testing today showed: grasses, ragweed, weeds, trees, dust mites and cockroach - Copy of test results provided.  - Avoidance measures provided. - Continue cough medication regimen (weaning to every 12 hours) for one week until all of this other medication is on board and then try to wean off of it completely.  - Continue with: Allegra (fexofenadine) 180mg  tablet TWICE DAILY EVERY DAY - Start taking: Nasacort (triamcinolone) two sprays per nostril daily EVERY DAY - You can use an extra dose of the antihistamine, if needed, for breakthrough symptoms.   - Consider nasal saline rinses 1-2 times daily to remove allergens from the nasal cavities as well as help with mucous clearance (this is especially helpful to do before the nasal sprays are given) - Consider allergy shots as a means of long-term control. - Allergy shots "re-train" and "reset" the immune system to ignore environmental allergens and decrease the resulting immune response to those allergens (sneezing, itchy watery eyes, runny nose, nasal congestion, etc).    - Allergy shots improve symptoms in 75-85% of patients.  - We can discuss more at the next appointment if the medications are not working for you.  2. Cough - Lung testing was in the 60% range and did not improve with the albuterol treatments. - I think you have asthma, although at this point in time I do not have proof of this with our testing. - However, we are going to treat this as asthma for now. - Xolair information and consent provided. - [name] will be reaching out to you to discuss the approval process.  - Spacer sample and demonstration provided. - Daily controller medication(s): Breztri two puffs twice daily with spacer - Prior to physical activity: albuterol 2 puffs 10-15 minutes before physical activity. - Rescue medications: albuterol 4 puffs every 4-6 hours as needed or albuterol nebulizer one vial every 4-6 hours as needed - Asthma control goals:  * Full participation in all desired activities (may need albuterol before activity) * Albuterol use two time or less a week on average (not counting use with activity) * Cough interfering with sleep two time or less a month * Oral  steroids no more than once a year * No hospitalizations  3. Possible GERD - Restart Protonix 40mg  daily for now. - Do this EVERY day until we can get symptoms under control. - Then we can start to wean things off.  4. Return in about 4 weeks (around 10/08/2019). This can be an in-person, a virtual Webex or a telephone follow up  visit.   Subjective:   Michele [name] is a 29 y.o. female presenting today for evaluation of  Chief Complaint  Patient presents with   Cough    all night and all day since March pulmonolgist     Michele [name] has a history of the following: Patient Active Problem List   Diagnosis Date Noted   Upper airway cough syndrome 07/16/2019   PSVT (paroxysmal supraventricular tachycardia) ([place]) 01/18/2019   Memory changes 04/24/2018   Left lumbar radiculitis 04/24/2018   Class 1 obesity due to excess calories without serious comorbidity with body mass index (BMI) of 30.0 to 30.9 in adult 04/24/2018    History obtained from: chart review and patient.  Michele [name] was referred by Hubbard Hartshorn, FNP.     Michele [name] is a 29 y.o. female presenting for an evaluation of cough and possible upper airway cough syndrome (followed by Dr. Melvyn Novas).   Her cough started early March. At the time she reported having rhinorrhea and mild cough. She took Claritin and Mucinex with no relief. Her cough got progressively worse.  In April of this year she was diagnosed with COVID-19 and this made her cough worse. Her cough is constant throughout the day and night. Her cough is productive with yellow sputum. She denies any fever, chills or tightness in her chest. She reports wheeze and shortness of breath after coughing a lot. She has been to see urgent care where she was treated for bronchitis and pneumonia.  She has also seen Dr. Melvyn Novas where she had a sinus CT on 08/24/19 showing chronic bilateral maxillary sinusitis and left ethmoid sinusitis. No acute abnormality. She also had a CT Chest without contrast on 08/24/2019 that showed central bronchial wall thickening throughout both lungs suggestive of acute or chronic bronchitis. There are a few scattered areas of subtle ground glass and tree-in-bud nodularity with in the peripheral aspect of the lung fields, likely residual of recent viral infection. No lobar air  space consolidation.  She has been on 5 different rounds of antibiotics, 3 depo medrol injections and 5 prednisone tapers. She did not notice any difference in her cough with steroids and antibiotics. Her sputum remained yellow throughout all the antibiotics she received. The only thing that helps her cough is taking Delsym every 4-6 hours, Robitussin DM every 4 hours, Mucinex DM 2 tablets every 12 hours and Hydrocodone at night that was given by Dr. Melvyn Novas.  She has tried taking Protonix in the morning and Pepcid at night for a month with no changes in symptoms. She denies any symptoms of heartburn or reflux.   Asthma/Respiratory Symptom History: She denies any past history of asthma and even now she does not have a diagnosis of such. She has never had a methacholine challenge and today she has zero reversibility. She was given a sample of Breo Ellipta and ProAir with no relief in cough. She did report that the ProAir did help with her wheezing. She has not used her albuterol since April; albuterol has never really helped the symptoms at all.   Allergic Rhinitis Symptom History: Her symptoms of allergies  started in middle school. She will take Claritin or Allergra as needed with good symptoms control. Her symptoms are worse in the spring and summer. She has tried fluticasone nasal spray in the past without consistent uses. She has never seen an allergist before or been skin tested. Today she reports a small amount of clear rhinorrhea and denies any nasal congestion or postnasal drip. She also denies any occular pruritius.  Food Allergy Symptom History: She denies any problems with food. She tolerates all of the major food allergens without adverse event.   Eczema Symptom History: She denies any history of eczema.  Stinging Insect Symptom History: She reports large local reactions with bee stings. In the past she was stung on her arm and her whole arm swelled. She denied any cardiopulmonary or  gastrointestinal symptoms.  Otherwise, there is no history of other atopic diseases, including asthma, food allergies, drug allergies, eczema, urticaria or contact dermatitis. There is no significant infectious history. Vaccinations are up to date.    Past Medical History: Patient Active Problem List   Diagnosis Date Noted   Upper airway cough syndrome 07/16/2019   PSVT (paroxysmal supraventricular tachycardia) ([place]) 01/18/2019   Memory changes 04/24/2018   Left lumbar radiculitis 04/24/2018   Class 1 obesity due to excess calories without serious comorbidity with body mass index (BMI) of 30.0 to 30.9 in adult 04/24/2018    Medication List:  Allergies as of 09/10/2019      Reactions   Codeine       Medication List       Accurate as of September 10, 2019 11:59 PM. If you have any questions, ask your nurse or doctor.        STOP taking these medications   famotidine 20 MG tablet Commonly known as: Pepcid Stopped by: Valentina Shaggy, MD   tiZANidine 2 MG tablet Commonly known as: ZANAFLEX Stopped by: Valentina Shaggy, MD   UNABLE TO FIND Stopped by: Valentina Shaggy, MD     TAKE these medications   albuterol 108 (90 Base) MCG/ACT inhaler Commonly known as: VENTOLIN HFA INHALE 2 PUFFS BY MOUTH EVERY 6 HOURS AS NEEDED FOR SHORTNESS OF BREATH What changed: Another medication with the same name was removed. Continue taking this medication, and follow the directions you see here. Changed by: Valentina Shaggy, MD   cetirizine 10 MG tablet Commonly known as: ZYRTEC Take 10 mg by mouth daily.   Flutter Devi Use as directed   hydrocodone-ibuprofen 5-200 MG tablet Commonly known as: VICOPROFEN Take 1 tablet by mouth every 8 (eight) hours as needed for pain. What changed: Another medication with the same name was removed. Continue taking this medication, and follow the directions you see here. Changed by: Valentina Shaggy, MD   pantoprazole 40 MG  tablet Commonly known as: Protonix Take 1 tablet (40 mg total) by mouth daily. Take 30-60 min before first meal of the day   predniSONE 10 MG tablet Commonly known as: DELTASONE Take  4 each am x 2 days,   2 each am x 2 days,  1 each am x 2 days and stop       Past Surgical History: Past Surgical History:  Procedure Laterality Date   NECK SURGERY       Family History: Family History  Problem Relation Age of Onset   Hypertension Mother    Allergic rhinitis Mother    Hypertension Father    Asthma Sister    Allergic rhinitis  Sister    Eczema Neg Hx    Urticaria Neg Hx      Social History: Haivyn lives at home with her two kids and fiancee.  She lives in an apartment with carpet throughout.  There is no known water damage or mildew.  She has electric heating and central air.  There are no animals located inside the house.  Dust mite covers on the bed or pillows.  She is not exposed to any tobacco or smoke has never smoked.  She is currently a Designer, fashion/clothing.   Review of Systems  Constitutional: Negative for chills and fever.  HENT: Negative for congestion, sinus pain and sore throat.   Eyes: Negative for blurred vision and pain.  Respiratory: Positive for cough, sputum production, shortness of breath and wheezing. Negative for hemoptysis.   Cardiovascular: Negative for chest pain.  Gastrointestinal: Negative for abdominal pain and heartburn.  Skin: Negative for itching and rash.  Neurological: Positive for headaches.       Objective:   Blood pressure 126/78, pulse 77, temperature 98.8 F (37.1 C), temperature source Temporal, resp. rate 16, height 5' 2.64" (1.591 m), weight 165 lb (74.8 kg), SpO2 97 %. Body mass index is 29.57 kg/m.   Physical Exam:   Physical Exam  Constitutional: She is oriented to person, place, and time. She appears well-developed and well-nourished.  HENT:  Head: Normocephalic and atraumatic.  Right Ear: External ear  normal.  Left Ear: External ear normal.  Mouth/Throat: Oropharynx is clear and moist.  Nose: bilateral turbinates moderately edematous and slightly erythematous with clear drainage.  Eyes: Conjunctivae are normal.  Cardiovascular: Regular rhythm and normal heart sounds.  Respiratory:   Expiratory wheezing throughout all lung fields  Musculoskeletal:     Cervical back: Neck supple.  Neurological: She is alert and oriented to person, place, and time.  Skin: Skin is warm.  Psychiatric: She has a normal mood and affect. Her behavior is normal. Judgment and thought content normal.     Diagnostic studies:   Spirometry: results abnormal (FEV1: 60%, FVC: 63%, FEV1/FVC: 82%).    Spirmoetry indicates moderate constriction. Xopenex four puffs via MDI treatment given in clinic with no improvement.  Allergy Studies:   Airborne Adult Perc - 09/25/2019 1040    Time Antigen Placed  1040    Allergen Manufacturer  Lavella Hammock    Location  Back    Number of Test  59    Panel 1  Select    1. Control-Buffer 50% Glycerol  Negative    2. Control-Histamine 1 mg/ml  2+    3. Albumin saline  Negative    4. [place]  2+    5. [place]  3+    6. [place]  3+    7. [place] Blue  2+    8. Meadow Fescue  2+    9. Perennial Rye  3+    10. Sweet Vernal  2+    11. Timothy  3+    12. Cocklebur  Negative    13. Burweed Marshelder  Negative    14. Ragweed, short  Negative    15. Ragweed, Giant  Negative    16. Plantain,  English  Negative    17. Lamb's Quarters  Negative    18. Sheep Sorrell  Negative    19. Rough Pigweed  Negative    20. Marsh Elder, Rough  Negative    21. Mugwort, Common  Negative    22. Ash mix  Negative  23Wendee Copp mix  Negative    24. Beech American  Negative    25. Box, Elder  Negative    26. Cedar, red  Negative    27. Cottonwood, [place]  Negative    28. Elm mix  Negative    29. Hickory  Negative    30. Maple mix  Negative    31. Oak, [place] mix  --   +/-   32. Pecan Pollen  --    +/-   33. Pine mix  Negative    34. Sycamore Eastern  Negative    35. [place], Black Pollen  Negative    36. Alternaria alternata  Negative    37. Cladosporium Herbarum  Negative    38. Aspergillus mix  Negative    39. Penicillium mix  Negative    40. Bipolaris sorokiniana (Helminthosporium)  Negative    41. Drechslera spicifera (Curvularia)  Negative    42. Mucor plumbeus  Negative    43. Fusarium moniliforme  Negative    44. Aureobasidium pullulans (pullulara)  Negative    45. Rhizopus oryzae  Negative    46. Botrytis cinera  Negative    47. Epicoccum nigrum  Negative    48. Phoma betae  Negative    49. Candida Albicans  Negative    50. Trichophyton mentagrophytes  Negative    51. Mite, D Farinae  5,000 AU/ml  3+    52. Mite, D Pteronyssinus  5,000 AU/ml  3+    53. Cat Hair 10,000 BAU/ml  Negative    54.  Dog Epithelia  Negative    55. Mixed Feathers  Negative    56. Horse Epithelia  Negative    57. Cockroach, German  Negative    58. Mouse  Negative    59. Tobacco Leaf  Negative     Intradermal - 09/10/19 1121    Time Antigen Placed  1121    Allergen Manufacturer  Lavella Hammock    Location  Arm    Number of Test  10    Control  Negative    Ragweed mix  2+    Weed mix  2+    Mold 2  Negative    Mold 3  Negative    Mold 4  Negative    Cat  Negative    Dog  Negative    Cockroach  3+       Allergy testing results were read and interpreted by myself, documented by clinical staff.   Thank you for the opportunity to care for this patient. Please do not hesitate to contact me with any questions.  Christine Dale,FNP [facility] [place]    I performed a history and physical examination of the patient and discussed her management with the Nurse Practitioner. I reviewed the Nurse Practitioner's note and agree with the documented findings and plan of care. The note in its entirety was edited by myself, including the physical exam, assessment, and plan.     Salvatore Marvel, MD [facility] [place]

## 2019-09-10 NOTE — Patient Instructions (Addendum)
1. Seasonal and perennial allergic rhinitis - Testing today showed: grasses, ragweed, weeds, trees, dust mites and cockroach - Copy of test results provided.  - Avoidance measures provided. - Continue cough medication regimen (weaning to every 12 hours) for one week until all of this other medication is on board and then try to wean off of it completely.  - Continue with: Allegra (fexofenadine) 180mg  tablet TWICE DAILY EVERY DAY - Start taking: Nasacort (triamcinolone) two sprays per nostril daily EVERY DAY - You can use an extra dose of the antihistamine, if needed, for breakthrough symptoms.  - Consider nasal saline rinses 1-2 times daily to remove allergens from the nasal cavities as well as help with mucous clearance (this is especially helpful to do before the nasal sprays are given) - Consider allergy shots as a means of long-term control. - Allergy shots "re-train" and "reset" the immune system to ignore environmental allergens and decrease the resulting immune response to those allergens (sneezing, itchy watery eyes, runny nose, nasal congestion, etc).    - Allergy shots improve symptoms in 75-85% of patients.  - We can discuss more at the next appointment if the medications are not working for you.  2. Cough - Lung testing was in the 60% range and did not improve with the albuterol treatments. - I think you have asthma, although at this point in time I do not have proof of this with our testing. - However, we are going to treat this as asthma for now. - Xolair information and consent provided. - [name] will be reaching out to you to discuss the approval process.  - Spacer sample and demonstration provided. - Daily controller medication(s): Breztri two puffs twice daily with spacer - Prior to physical activity: albuterol 2 puffs 10-15 minutes before physical activity. - Rescue medications: albuterol 4 puffs every 4-6 hours as needed or albuterol nebulizer one vial every 4-6 hours as  needed - Asthma control goals:  * Full participation in all desired activities (may need albuterol before activity) * Albuterol use two time or less a week on average (not counting use with activity) * Cough interfering with sleep two time or less a month * Oral steroids no more than once a year * No hospitalizations  3. Possible GERD - Restart Protonix 40mg  daily for now. - Do this EVERY day until we can get symptoms under control. - Then we can start to wean things off.  4. Return in about 4 weeks (around 10/08/2019). This can be an in-person, a virtual Webex or a telephone follow up visit.   Please inform us of any Emergency Department visits, hospitalizations, or changes in symptoms. Call us before going to the ED for breathing or allergy symptoms since we might be able to fit you in for a sick visit. Feel free to contact us anytime with any questions, problems, or concerns.  It was a pleasure to meet you today!  Websites that have reliable patient information: 1. American Academy of Asthma, Allergy, and Immunology: www.aaaai.org 2. Food Allergy Research and Education (FARE): foodallergy.org 3. Mothers of Asthmatics: http://www.asthmacommunitynetwork.org 4. American College of Allergy, Asthma, and Immunology: www.acaai.org   COVID-19 Vaccine Information can be found at: [email or web address].co.uk For questions related to vaccine distribution or appointments, please email [email or web address] or call [phone].     "Like" [place] on Facebook and Instagram for our latest updates!       HAPPY SPRING!  Make sure you are registered to vote! If you have moved or changed  any of your contact information, you will need to get this updated before voting!  In some cases, you MAY be able to register to vote online: [email or web address]     Reducing Pollen Exposure  The American Academy of Allergy,  Asthma and Immunology suggests the following steps to reduce your exposure to pollen during allergy seasons.    1. Do not hang sheets or clothing out to dry; pollen may collect on these items. 2. Do not mow lawns or spend time around freshly cut grass; mowing stirs up pollen. 3. Keep windows closed at night.  Keep car windows closed while driving. 4. Minimize morning activities outdoors, a time when pollen counts are usually at their highest. 5. Stay indoors as much as possible when pollen counts or humidity is high and on windy days when pollen tends to remain in the air longer. 6. Use air conditioning when possible.  Many air conditioners have filters that trap the pollen spores. 7. Use a HEPA room air filter to remove pollen form the indoor air you breathe.  Control of Dust Mite Allergen    Dust mites play a major role in allergic asthma and rhinitis.  They occur in environments with high humidity wherever human skin is found.  Dust mites absorb humidity from the atmosphere (ie, they do not drink) and feed on organic matter (including shed human and animal skin).  Dust mites are a microscopic type of insect that you cannot see with the naked eye.  High levels of dust mites have been detected from mattresses, pillows, carpets, upholstered furniture, bed covers, clothes, soft toys and any woven material.  The principal allergen of the dust mite is found in its feces.  A gram of dust may contain 1,000 mites and 250,000 fecal particles.  Mite antigen is easily measured in the air during house cleaning activities.  Dust mites do not bite and do not cause harm to humans, other than by triggering allergies/asthma.    Ways to decrease your exposure to dust mites in your home:  1. Encase mattresses, box springs and pillows with a mite-impermeable barrier or cover   2. Wash sheets, blankets and drapes weekly in hot water (130 F) with detergent and dry them in a dryer on the hot setting.  3. Have the  room cleaned frequently with a vacuum cleaner and a damp dust-mop.  For carpeting or rugs, vacuuming with a vacuum cleaner equipped with a high-efficiency particulate air (HEPA) filter.  The dust mite allergic individual should not be in a room which is being cleaned and should wait 1 hour after cleaning before going into the room. 4. Do not sleep on upholstered furniture (eg, couches).   5. If possible removing carpeting, upholstered furniture and drapery from the home is ideal.  Horizontal blinds should be eliminated in the rooms where the person spends the most time (bedroom, study, television room).  Washable vinyl, roller-type shades are optimal. 6. Remove all non-washable stuffed toys from the bedroom.  Wash stuffed toys weekly like sheets and blankets above.   7. Reduce indoor humidity to less than 50%.  Inexpensive humidity monitors can be purchased at most hardware stores.  Do not use a humidifier as can make the problem worse and are not recommended.    Control of Cockroach Allergen  Cockroach allergen has been identified as an important cause of acute attacks of asthma, especially in urban settings.  There are fifty-five species of cockroach that exist in the [place], however  only three, the [place], [place] and [place] species produce allergen that can affect patients with Asthma.  Allergens can be obtained from fecal particles, egg casings and secretions from cockroaches.    1. Remove food sources. 2. Reduce access to water. 3. Seal access and entry points. 4. Spray runways with 0.5-1% Diazinon or Chlorpyrifos 5. Blow boric acid power under stoves and refrigerator. 6. Place bait stations (hydramethylnon) at feeding sites.  Allergy Shots   Allergies are the result of a chain reaction that starts in the immune system. Your immune system controls how your body defends itself. For instance, if you have an allergy to pollen, your immune system identifies pollen as an invader or  allergen. Your immune system overreacts by producing antibodies called Immunoglobulin E (IgE). These antibodies travel to cells that release chemicals, causing an allergic reaction.  The concept behind allergy immunotherapy, whether it is received in the form of shots or tablets, is that the immune system can be desensitized to specific allergens that trigger allergy symptoms. Although it requires time and patience, the payback can be long-term relief.  How Do Allergy Shots Work?  Allergy shots work much like a vaccine. Your body responds to injected amounts of a particular allergen given in increasing doses, eventually developing a resistance and tolerance to it. Allergy shots can lead to decreased, minimal or no allergy symptoms.  There generally are two phases: build-up and maintenance. Build-up often ranges from three to six months and involves receiving injections with increasing amounts of the allergens. The shots are typically given once or twice a week, though more rapid build-up schedules are sometimes used.  The maintenance phase begins when the most effective dose is reached. This dose is different for each person, depending on how allergic you are and your response to the build-up injections. Once the maintenance dose is reached, there are longer periods between injections, typically two to four weeks.  Occasionally doctors give cortisone-type shots that can temporarily reduce allergy symptoms. These types of shots are different and should not be confused with allergy immunotherapy shots.  Who Can Be Treated with Allergy Shots?  Allergy shots may be a good treatment approach for people with allergic rhinitis (hay fever), allergic asthma, conjunctivitis (eye allergy) or stinging insect allergy.   Before deciding to begin allergy shots, you should consider:  . The length of allergy season and the severity of your symptoms . Whether medications and/or changes to your environment can  control your symptoms . Your desire to avoid long-term medication use . Time: allergy immunotherapy requires a major time commitment . Cost: may vary depending on your insurance coverage  Allergy shots for children age 1 and older are effective and often well tolerated. They might prevent the onset of new allergen sensitivities or the progression to asthma.  Allergy shots are not started on patients who are pregnant but can be continued on patients who become pregnant while receiving them. In some patients with other medical conditions or who take certain common medications, allergy shots may be of risk. It is important to mention other medications you talk to your allergist.   When Will I Feel Better?  Some may experience decreased allergy symptoms during the build-up phase. For others, it may take as long as 12 months on the maintenance dose. If there is no improvement after a year of maintenance, your allergist will discuss other treatment options with you.  If you aren't responding to allergy shots, it may be because there is  not enough dose of the allergen in your vaccine or there are missing allergens that were not identified during your allergy testing. Other reasons could be that there are high levels of the allergen in your environment or major exposure to non-allergic triggers like tobacco smoke.  What Is the Length of Treatment?  Once the maintenance dose is reached, allergy shots are generally continued for three to five years. The decision to stop should be discussed with your allergist at that time. Some people may experience a permanent reduction of allergy symptoms. Others may relapse and a longer course of allergy shots can be considered.  What Are the Possible Reactions?  The two types of adverse reactions that can occur with allergy shots are local and systemic. Common local reactions include very mild redness and swelling at the injection site, which can happen immediately  or several hours after. A systemic reaction, which is less common, affects the entire body or a particular body system. They are usually mild and typically respond quickly to medications. Signs include increased allergy symptoms such as sneezing, a stuffy nose or hives.  Rarely, a serious systemic reaction called anaphylaxis can develop. Symptoms include swelling in the throat, wheezing, a feeling of tightness in the chest, nausea or dizziness. Most serious systemic reactions develop within 30 minutes of allergy shots. This is why it is strongly recommended you wait in your doctor's office for 30 minutes after your injections. Your allergist is trained to watch for reactions, and his or her staff is trained and equipped with the proper medications to identify and treat them.  Who Should Administer Allergy Shots?  The preferred location for receiving shots is your prescribing allergist's office. Injections can sometimes be given at another facility where the physician and staff are trained to recognize and treat reactions, and have received instructions by your prescribing allergist.

## 2019-09-12 ENCOUNTER — Encounter: Payer: Self-pay | Admitting: Allergy & Immunology

## 2019-09-13 NOTE — Progress Notes (Signed)
Please let patient know sputum culture was normal, no growth

## 2019-09-22 ENCOUNTER — Ambulatory Visit: Payer: Medicaid Other | Admitting: Internal Medicine

## 2019-09-22 NOTE — Progress Notes (Deleted)
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 Consult  onset abrupt in early March 2021 with typical nasal congestion/ thought it was allergy rx clariton and mucinex but then cough much worse to point of vomiting/urinary incont  and multiple ov's rx singulair, pred, breo, alb multiple forms and not better Dyspnea: feeling like drowning in mucus but minimal actual production yellow mucus same from nose Cough: worsening around 6 pm after supper  Sleep: sitting up 60 degrees  SABA use: nebulizer every 3-4hours may help some  zpak no better rec mucinex dm 1200 mg every 12 hours supplement with hydromet as needed     Augmentin 875 mg take one pill twice daily  X 10 days .  Prednisone 10 mg take  4 each am x 2 days,   2 each am x 2 days,  1 each am x 2 days and stop   Protonix (pantoprazole) Take 30-60 min before first meal of the day and Pepcid 20 mg one right after supper until return  GERD  Diet  For breathing can use nebulizer up to every 4 hours as needed Stop singulair, proair, zyrtec, and Breo   07/22/2019  f/u ov/Kaemon Barnett re: refractory AB onset 1st of March 2021  Chief Complaint  Patient presents with  . Follow-up    Cough has improved slightly but still requires hydromet daily to control the cough. She is still coughing up yellow green sputum.   Dyspnea:   Mostly related to coughing fits  Cough: [name] yellow to green all day  Sleeping: at 60 degrees poorly due to cough  SABA use: duoneb q 4 h  02: none rec Make sure the mucinex dm is 1200 mg every 12 hours as needed for cough or congestion and use the flutter valve as possible  For breathing > albuterol nebulizers every 4 hours as needed  We will call to schedule a CT of sinus > chronic changes only  Prednisone 10 mg Take 4 for three days 3 for three days 2 for three days 1 for three days and stop    Please schedule a follow up office visit in 2 weeks, sooner if needed  with all medications /inhalers/ solutions in hand so we can verify exactly what you are taking. This includes all medications from all doctors and over the counters  07/28/19  Pos covid at [facility]   08/09/19 NP televisit Recommend: Chlorphentermine 4mg   q 4 hours prn cough/allergy symptoms (do not combine with cough medication) Add flonase nasal spray once daily  Continue Mucinex 1,200mg  twice daily  Continue Flutter valve Continue nebulizer every 4 hours  Orders: Sputum culture >nl flora  Chest CT wo contrast  May 18th c/w acute/ chronic bronchitis  Rx: Zpack sent to pharamcy  Refill hycodan cough syrup -  take at bedtime       09/03/2019  f/u ov/Kalimah Capurro re: refractory cough  Chief Complaint  Patient presents with  . Follow-up    pt has a chronic cough coughing up green muniex.  Dyspnea:  Mostly sob when coughing  Cough: 24/7 > mucus produced here was only slightly off white and only after a severe coughing fit / min volume  Sleeping: only if takes hydromet and does fine then  SABA use: neb  02: none  rec Mucinex 1200 mg every 12 hours  Delsym 2 tsp every 12 hours  If still coughing > hydrocodone 5mg  up to every 4 hours until no longer coughing x 3 days  Depomedrol 120 mg IM today  Continue acid suppression / keep the candy handy Ok to use nebulizer up to every 4 hours if needed for breathing   Allergy  profile 09/03/2019 >  Eos 0.1 /  IgE  222  RAST pan positive (neg mold/cat only)> referred to Allergy   [facility] allergy eval 09/14/19 :  started breztri 2 bid with pfts showing restriction only / consider xolair rx    09/22/2019  f/u ov/Sahas Sluka re: cough since early March 2021  ? Adherent?  No chief complaint on file.    Dyspnea:  *** Cough: *** Sleeping: *** SABA use: *** 02: ***   No obvious day to day or daytime variability or assoc excess/ purulent sputum or mucus plugs or hemoptysis or cp or chest tightness, subjective wheeze or overt sinus or hb symptoms.   *** without nocturnal  or early am exacerbation  of respiratory  c/o's or need for noct saba. Also denies any obvious fluctuation of symptoms with weather or environmental changes or other aggravating or alleviating factors except as outlined above   No unusual exposure hx or h/o childhood pna/ asthma or knowledge of premature birth.  Current Allergies, Complete Past Medical History, Past Surgical History, Family History, and Social History were reviewed in [employer] record.  ROS  The following are not active complaints unless bolded Hoarseness, sore throat, dysphagia, dental problems, itching, sneezing,  nasal congestion or discharge of excess mucus or purulent secretions, ear ache,   fever, chills, sweats, unintended wt loss or wt gain, classically pleuritic or exertional cp,  orthopnea pnd or arm/hand swelling  or leg swelling, presyncope, palpitations, abdominal pain, anorexia, nausea, vomiting, diarrhea  or change in bowel habits or change in bladder habits, change in stools or change in urine, dysuria, hematuria,  rash, arthralgias, visual complaints, headache, numbness, weakness or ataxia or problems with walking or coordination,  change in mood or  memory.        No outpatient medications have been marked as taking for the 09/22/19 encounter (Appointment) with Tanda Rockers, MD.                                Objective:       .09/22/2019      ***  09/03/2019      165   07/22/19 173 lb (78.5 kg)  07/16/19 169 lb 3.2 oz (76.7 kg)  03/15/19 175 lb 11.2 oz (79.7 kg)          harsh transmitted upper airway "wheezes"                   Assessment

## 2019-09-24 NOTE — Progress Notes (Signed)
Letter sent to Ms. Carroll with sputum culture results.

## 2019-10-07 DIAGNOSIS — Z419 Encounter for procedure for purposes other than remedying health state, unspecified: Secondary | ICD-10-CM | POA: Diagnosis not present

## 2019-10-08 ENCOUNTER — Ambulatory Visit: Payer: Medicaid Other | Admitting: Family

## 2019-11-03 DIAGNOSIS — S83004A Unspecified dislocation of right patella, initial encounter: Secondary | ICD-10-CM | POA: Diagnosis not present

## 2019-11-03 DIAGNOSIS — Z885 Allergy status to narcotic agent status: Secondary | ICD-10-CM | POA: Diagnosis not present

## 2019-11-07 DIAGNOSIS — Z419 Encounter for procedure for purposes other than remedying health state, unspecified: Secondary | ICD-10-CM | POA: Diagnosis not present

## 2019-12-20 NOTE — Progress Notes (Signed)
Name: Michele [name]   MRN: [id number]    DOB: July 19, 1990   Date:12/22/2019       Progress Note  Subjective  Chief Complaint  Memory Loss Concern  HPI   Snoring: she states she noticed memory changes - difficulty remembering things and now more difficulty when studying. She states initially she blamed the fact that she goes to school full time and has children, but symptoms are getting worse , she used to do this as a single mother without problems. She snores at night, wakes up feeling tired. She goes to bed around 10 pm and wakes up around 5:30 am . She has her tonsils and adenoids. ESS today was   Shaking feeling: usually happens after she eats, about one hours later, she develops headaches and shakes, discussed high protein diet and avoid carbs.    Patient Active Problem List   Diagnosis Date Noted  . Upper airway cough syndrome 07/16/2019  . PSVT (paroxysmal supraventricular tachycardia) ([place]) 01/18/2019  . Memory changes 04/24/2018  . Left lumbar radiculitis 04/24/2018  . Class 1 obesity due to excess calories without serious comorbidity with body mass index (BMI) of 30.0 to 30.9 in adult 04/24/2018    Past Surgical History:  Procedure Laterality Date  . LIPOSUCTION AUOLOGOUS FAT TRANSFER TO BUTTOCKS  03/25/2019   in [place], Dr. Lynann Bologna   . NECK SURGERY      Family History  Problem Relation Age of Onset  . Hypertension Mother   . Allergic rhinitis Mother   . Hypertension Father   . Asthma Sister   . Allergic rhinitis Sister   . Dementia Paternal Grandmother   . Eczema Neg Hx   . Urticaria Neg Hx     Social History   Tobacco Use  . Smoking status: Never Smoker  . Smokeless tobacco: Never Used  Substance Use Topics  . Alcohol use: Never    No current outpatient medications on file.  Allergies  Allergen Reactions  . Codeine     I personally reviewed active problem list, medication list, allergies, family history, social history, health maintenance with the  patient/caregiver today.   ROS  Constitutional: Negative for fever or weight change.  Respiratory: Negative for cough and shortness of breath.   Cardiovascular: Negative for chest pain or palpitations.  Gastrointestinal: Negative for abdominal pain, no bowel changes.  Musculoskeletal: Negative for gait problem or joint swelling.  Skin: Negative for rash.  Neurological: Negative for dizziness or headache.  No other specific complaints in a complete review of systems (except as listed in HPI above).  Objective  Vitals:   12/22/19 1410  BP: 110/84  Pulse: 73  Resp: 16  Temp: 98.3 F (36.8 C)  TempSrc: Oral  SpO2: 96%  Weight: 167 lb 6.4 oz (75.9 kg)  Height: 5\' 3"  (1.6 m)    Body mass index is 29.65 kg/m.  Physical Exam  Constitutional: Patient appears well-developed and well-nourished. Overweight.  No distress.  HEENT: head atraumatic, normocephalic, pupils equal and reactive to light,neck supple Cardiovascular: Normal rate, regular rhythm and normal heart sounds.  No murmur heard. No BLE edema. Pulmonary/Chest: Effort normal and breath sounds normal. No respiratory distress. Abdominal: Soft.  There is no tenderness. Psychiatric: Patient has a normal mood and affect. behavior is normal. Judgment and thought content normal.  PHQ2/9: Depression screen [facility] 2/9 12/22/2019 07/15/2019 03/15/2019 03/01/2019 01/18/2019  Decreased Interest 0 0 0 0 0  Down, Depressed, Hopeless 0 0 0 0 0  PHQ - 2  Score 0 0 0 0 0  Altered sleeping - 0 0 0 0  Tired, decreased energy - 0 0 0 0  Change in appetite - 0 0 0 0  Feeling bad or failure about yourself  - 0 0 0 0  Trouble concentrating - 0 0 0 0  Moving slowly or fidgety/restless - 0 0 0 0  Suicidal thoughts - 0 0 0 0  PHQ-9 Score - 0 0 0 0  Difficult doing work/chores - Not difficult at all Not difficult at all Not difficult at all Not difficult at all    phq 9 is negative   Fall Risk: Fall Risk  12/22/2019 07/15/2019 03/15/2019  03/01/2019 01/18/2019  Falls in the past year? 0 0 0 0 0  Number falls in past yr: 0 0 0 0 0  Injury with Fall? 0 0 0 0 0  Follow up - Falls evaluation completed Falls evaluation completed - Falls evaluation completed     Functional Status Survey: Is the patient deaf or have difficulty hearing?: No Does the patient have difficulty seeing, even when wearing glasses/contacts?: No Does the patient have difficulty concentrating, remembering, or making decisions?: Yes Does the patient have difficulty walking or climbing stairs?: No Does the patient have difficulty dressing or bathing?: No Does the patient have difficulty doing errands alone such as visiting a doctor's office or shopping?: No    Assessment & Plan  1. Hypoglycemia  Discussed importance of eating / following a diabetic diet   2. Flu vaccine need  Refused   3. Snoring  - Ambulatory referral to Sleep Studies  4. Memory changes  - Ambulatory referral to Sleep Studies Discussed mindfulness, therapy and try to not multitask

## 2019-12-22 ENCOUNTER — Encounter: Payer: Self-pay | Admitting: Family Medicine

## 2019-12-22 ENCOUNTER — Ambulatory Visit: Payer: Medicaid Other | Admitting: Family Medicine

## 2019-12-22 ENCOUNTER — Other Ambulatory Visit: Payer: Self-pay

## 2019-12-22 VITALS — BP 110/84 | HR 73 | Temp 98.3°F | Resp 16 | Ht 63.0 in | Wt 167.4 lb

## 2019-12-22 DIAGNOSIS — R0683 Snoring: Secondary | ICD-10-CM

## 2019-12-22 DIAGNOSIS — Z23 Encounter for immunization: Secondary | ICD-10-CM | POA: Diagnosis not present

## 2019-12-22 DIAGNOSIS — R413 Other amnesia: Secondary | ICD-10-CM

## 2019-12-22 DIAGNOSIS — E162 Hypoglycemia, unspecified: Secondary | ICD-10-CM | POA: Diagnosis not present

## 2019-12-22 NOTE — Patient Instructions (Signed)
Look into options for a therapist. Contact your insurance company and they will let you know what is covered and how to set an appointment up.   For hyper/hypoglycemia, be sure to monitor food intake and make sure you are consuming enough calories/sugars.

## 2020-01-05 DIAGNOSIS — F411 Generalized anxiety disorder: Secondary | ICD-10-CM | POA: Diagnosis not present

## 2020-02-08 ENCOUNTER — Encounter: Payer: Self-pay | Admitting: Family Medicine

## 2020-02-21 ENCOUNTER — Other Ambulatory Visit: Payer: Self-pay

## 2020-02-21 ENCOUNTER — Encounter: Payer: Self-pay | Admitting: Family Medicine

## 2020-02-21 ENCOUNTER — Ambulatory Visit: Payer: Medicaid Other | Admitting: Family Medicine

## 2020-02-21 ENCOUNTER — Other Ambulatory Visit (HOSPITAL_COMMUNITY)
Admission: RE | Admit: 2020-02-21 | Discharge: 2020-02-21 | Disposition: A | Discharge status: Home / Self Care | Payer: Medicaid Other | Source: Ambulatory Visit | Attending: Family Medicine | Admitting: Family Medicine

## 2020-02-21 VITALS — BP 130/80 | HR 94 | Temp 98.7°F | Resp 16 | Ht 61.5 in | Wt 167.5 lb

## 2020-02-21 DIAGNOSIS — R7989 Other specified abnormal findings of blood chemistry: Secondary | ICD-10-CM

## 2020-02-21 DIAGNOSIS — E785 Hyperlipidemia, unspecified: Secondary | ICD-10-CM

## 2020-02-21 DIAGNOSIS — M5416 Radiculopathy, lumbar region: Secondary | ICD-10-CM | POA: Diagnosis not present

## 2020-02-21 DIAGNOSIS — N760 Acute vaginitis: Secondary | ICD-10-CM | POA: Insufficient documentation

## 2020-02-21 DIAGNOSIS — Z0000 Encounter for general adult medical examination without abnormal findings: Secondary | ICD-10-CM

## 2020-02-21 DIAGNOSIS — I471 Supraventricular tachycardia: Secondary | ICD-10-CM | POA: Diagnosis not present

## 2020-02-21 NOTE — Progress Notes (Signed)
Patient: Michele [name], Female    DOB: 1991-01-19, 29 y.o.   MRN: [id number] Steele Sizer, MD Visit Date: 02/21/2020  Today's Provider: Delsa Grana, PA-C   Chief Complaint  Patient presents with  . Annual Exam   Subjective:   Patient new to me, here for CPE  Annual physical exam:  Michele [name] is a 29 y.o. female who presents today for complete physical exam:  Exercise/Activity:  No exercise  Diet/nutrition:  Cooks a lot, eats all food groups, well balanced Sleep:   Sleeps good, but always feels tired, was going to do a sleep study per past visit with Dr. Ancil Boozer  Dx with tachycardia a long time ago, was on meds in the past, no palpitations or sx currently, also had past abnormal TSH  [facility] in [place]   USPSTF grade A and B recommendations - reviewed and addressed today  Depression:  Phq 9 completed today by patient, was reviewed by me with patient in the room PHQ score is neg, pt feels good, but tired PHQ 2/9 Scores 02/21/2020 12/22/2019 07/15/2019 03/15/2019  PHQ - 2 Score 2 0 0 0  PHQ- 9 Score - - 0 0   Depression screen [facility] 2/9 02/21/2020 12/22/2019 07/15/2019 03/15/2019 03/01/2019  Decreased Interest 1 0 0 0 0  Down, Depressed, Hopeless 1 0 0 0 0  PHQ - 2 Score 2 0 0 0 0  Altered sleeping - - 0 0 0  Tired, decreased energy - - 0 0 0  Change in appetite - - 0 0 0  Feeling bad or failure about yourself  - - 0 0 0  Trouble concentrating - - 0 0 0  Moving slowly or fidgety/restless - - 0 0 0  Suicidal thoughts - - 0 0 0  PHQ-9 Score - - 0 0 0  Difficult doing work/chores - - Not difficult at all Not difficult at all Not difficult at all    Alcohol screening:   Office Visit from 12/22/2019 in [facility]  AUDIT-C Score 0      Immunizations and Health Maintenance: Health Maintenance  Topic Date Due  . COVID-19 Vaccine (1) Never done  . INFLUENZA VACCINE  07/06/2020 (Originally 11/07/2019)  . PAP-Cervical Cytology  Screening  04/24/2021  . PAP SMEAR-Modifier  04/24/2021  . TETANUS/TDAP  01/30/2025  . Hepatitis C Screening  Completed  . HIV Screening  Completed     Hep C Screening: done in the past  STD testing and prevention (HIV/chl/gon/syphilis):  see above, no additional testing desired by pt today  Intimate partner violence:  Safe  Sexual History/Pain during Intercourse:  Engaged- sexually active monogomous 3+ years  Menstrual History/LMP/Abnormal Bleeding:  Vaginal dryness, worried about her pH, sometimes an odor, shes tried monistat No LMP recorded. (Menstrual status: IUD).  Incontinence Symptoms:   none  Breast cancer: not due per age Last Mammogram: *see HM list above BRCA gene screening: n/a  Cervical cancer screening:  Up to date   Pt UTD family hx of cancers - breast, ovarian, uterine, colon:     Osteoporosis:     Discussion on osteoporosis per age, including high calcium and vitamin D supplementation, weight bearing exercises n/a  Skin cancer:  Hx of skin CA -  NO  hx Discussed atypical lesions   Colorectal cancer:   Colonoscopy is not due for age    Discussed concerning signs and sx of CRC, pt denies   Lung cancer:  Low Dose CT Chest recommended if Age 59-80 years, 30 pack-year currently smoking OR have quit w/in 15years. Patient does not qualify.    Social History   Tobacco Use  . Smoking status: Never Smoker  . Smokeless tobacco: Never Used  Vaping Use  . Vaping Use: Never used  Substance Use Topics  . Alcohol use: Never  . Drug use: Never       [facility] from 12/22/[facility] in [facility]  AUDIT-C Score 0      Family History  Problem Relation Age of Onset  . Hypertension Mother   . Allergic rhinitis Mother   . Hypertension Father   . Asthma Sister   . Allergic rhinitis Sister   . Dementia Paternal Grandmother   . Eczema Neg Hx   . Urticaria Neg Hx      Blood pressure/Hypertension: BP Readings from Last 3 Encounters:    02/21/20 130/80  12/22/19 110/84  09/10/19 126/78    Weight/Obesity: Wt Readings from Last 3 Encounters:  02/21/20 167 lb 8 oz (76 kg)  12/22/19 167 lb 6.4 oz (75.9 kg)  09/10/19 165 lb (74.8 kg)   BMI Readings from Last 3 Encounters:  02/21/20 31.14 kg/m  12/22/19 29.65 kg/m  09/10/19 29.57 kg/m     Lipids:  Lab Results  Component Value Date   CHOL 172 04/24/2018   Lab Results  Component Value Date   HDL 33 (L) 04/24/2018   Lab Results  Component Value Date   LDLCALC 112 (H) 04/24/2018   Lab Results  Component Value Date   TRIG 152 (H) 04/24/2018   Lab Results  Component Value Date   CHOLHDL 5.2 (H) 04/24/2018   No results found for: LDLDIRECT Based on the results of lipid panel his/her cardiovascular risk factor ( using [place] )  in the next 10 years is: The ASCVD Risk score Mikey Bussing DC Jr., et al., 2013) failed to calculate for the following reasons:   The 2013 ASCVD risk score is only valid for ages 35 to 5 Glucose:  Glucose, Bld  Date Value Ref Range Status  03/15/2019 93 65 - 99 mg/dL Final    Comment:    .            Fasting reference interval .   01/18/2019 87 65 - 99 mg/dL Final    Comment:    .            Fasting reference interval .   04/24/2018 59 (L) 65 - 139 mg/dL Final    Comment:    .        Non-fasting reference interval .    Hypertension: BP Readings from Last 3 Encounters:  02/21/20 130/80  12/22/19 110/84  09/10/19 126/78   Obesity: Wt Readings from Last 3 Encounters:  02/21/20 167 lb 8 oz (76 kg)  12/22/19 167 lb 6.4 oz (75.9 kg)  09/10/19 165 lb (74.8 kg)   BMI Readings from Last 3 Encounters:  02/21/20 31.14 kg/m  12/22/19 29.65 kg/m  09/10/19 29.57 kg/m      Advanced Care Planning:  A voluntary discussion about advance care planning including the explanation and discussion of advance directives.    Social History      She        Social History   Socioeconomic History  . Marital status:  Single    Spouse name: Not on file  . Number of children: 2  . Years of education: Not on  file  . Highest education level: Not on file  Occupational History  . Occupation: Sports administrator    Comment: [employer]  Tobacco Use  . Smoking status: Never Smoker  . Smokeless tobacco: Never Used  Vaping Use  . Vaping Use: Never used  Substance and Sexual Activity  . Alcohol use: Never  . Drug use: Never  . Sexual activity: Yes    Partners: Male    Birth control/protection: I.U.D.  Other Topics Concern  . 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 Difficulty of Paying Living Expenses: Patient refused  Food Insecurity: Unknown  . Worried About Kalysta fundraiser in the Last Year: Patient refused  . Ran Out of Food in the Last Year: Patient refused  Transportation Needs: Unknown  . Lack of Transportation (Medical): Patient refused  . Lack of Transportation (Non-Medical): Patient refused  Physical Activity: Inactive  . Days of Exercise per Week: 0 days  . Minutes of Exercise per Session: 0 min  Stress: Stress Concern Present  . Feeling of Stress : Very much  Social Connections: Moderately Isolated  . Frequency of Communication with Friends and Family: More than three times a week  . Frequency of Social Gatherings with Friends and Family: Once a week  . Attends Religious Services: Never  . Active Member of Clubs or Organizations: Yes  . Attends Archivist Meetings: More than 4 times per year  . Marital Status: Never married    Family History        Family History  Problem Relation Age of Onset  . Hypertension Mother   . Allergic rhinitis Mother   . Hypertension Father   .  Asthma Sister   . Allergic rhinitis Sister   . Dementia Paternal Grandmother   . Eczema Neg Hx   . Urticaria Neg Hx     Patient Active Problem List   Diagnosis Date Noted  . PSVT (paroxysmal supraventricular tachycardia) ([place]) 01/18/2019  . Memory changes 04/24/2018  . Left lumbar radiculitis 04/24/2018    Past Surgical History:  Procedure Laterality Date  . LIPOSUCTION AUOLOGOUS FAT TRANSFER TO BUTTOCKS  03/25/2019   in [place], Dr. Lynann Bologna   . NECK SURGERY      No current outpatient medications on file.  Allergies  Allergen Reactions  . Codeine     Patient Care Team: Steele Sizer, MD as PCP - General (Family Medicine)  Review of Systems  10 Systems reviewed and are negative for acute change except as noted in the HPI.   I personally reviewed active problem list, medication list, allergies, family history, social history, health maintenance, notes from last encounter, lab results, imaging with the patient/caregiver today.        Objective:   Vitals:  Vitals:   02/21/20 1055  BP: 130/80  Pulse: 94  Resp: 16  Temp: 98.7 F (37.1 C)  TempSrc: Oral  SpO2: 98%  Weight: 167 lb 8 oz (76 kg)  Height: 5' 1.5" (1.562 m)    Body mass index is 31.14 kg/m.  Physical Exam Vitals and nursing note reviewed.  Constitutional:      General:  She is not in acute distress.    Appearance: Normal appearance. She is well-developed. She is obese. She is not ill-appearing, toxic-appearing or diaphoretic.     Interventions: Face mask in place.  HENT:     Head: Normocephalic and atraumatic.     Right Ear: External ear normal.     Left Ear: External ear normal.  Eyes:     General: Lids are normal. No scleral icterus.       Right eye: No discharge.        Left eye: No discharge.     Conjunctiva/sclera: Conjunctivae normal.  Neck:     Thyroid: No thyroid mass, thyromegaly or thyroid tenderness.     Trachea: Phonation normal. No tracheal deviation.  Cardiovascular:      Rate and Rhythm: Normal rate and regular rhythm.     Pulses: Normal pulses.          Radial pulses are 2+ on the right side and 2+ on the left side.       Posterior tibial pulses are 2+ on the right side and 2+ on the left side.     Heart sounds: Normal heart sounds. No murmur heard.  No friction rub. No gallop.   Pulmonary:     Effort: Pulmonary effort is normal. No respiratory distress.     Breath sounds: Normal breath sounds. No stridor. No wheezing, rhonchi or rales.  Chest:     Chest wall: No tenderness.  Abdominal:     General: Bowel sounds are normal. There is no distension.     Palpations: Abdomen is soft.     Tenderness: There is no abdominal tenderness. There is no right CVA tenderness, left CVA tenderness or guarding.  Musculoskeletal:     Cervical back: Normal range of motion and neck supple.     Right lower leg: No edema.     Left lower leg: No edema.  Skin:    General: Skin is warm and dry.     Coloration: Skin is not jaundiced or pale.     Findings: No rash.  Neurological:     Mental Status: She is alert. Mental status is at baseline.     Motor: No abnormal muscle tone.     Gait: Gait normal.  Psychiatric:        Mood and Affect: Mood normal.        Speech: Speech normal.        Behavior: Behavior normal. Behavior is cooperative.       Fall Risk: Fall Risk  02/21/2020 12/22/2019 07/15/2019 03/15/2019 03/01/2019  Falls in the past year? 0 0 0 0 0  Number falls in past yr: 0 0 0 0 0  Injury with Fall? 0 0 0 0 0  Follow up - - Falls evaluation completed Falls evaluation completed -    Functional Status Survey: Is the patient deaf or have difficulty hearing?: No Does the patient have difficulty seeing, even when wearing glasses/contacts?: No Does the patient have difficulty concentrating, remembering, or making decisions?: Yes Does the patient have difficulty walking or climbing stairs?: No Does the patient have difficulty dressing or bathing?: No Does the  patient have difficulty doing errands alone such as visiting a doctor's office or shopping?: No   Assessment & Plan:    CPE completed today  . USPSTF grade A and B recommendations reviewed with patient; age-appropriate recommendations, preventive care, screening tests, etc discussed and encouraged; healthy living encouraged; see AVS for patient education  given to patient  . Discussed importance of 150 minutes of physical activity weekly, AHA exercise recommendations given to pt in AVS/handout  . Discussed importance of healthy diet:  eating lean meats and proteins, avoiding trans fats and saturated fats, avoid simple sugars and excessive carbs in diet, eat 6 servings of fruit/vegetables daily and drink plenty of water and avoid sweet beverages.    . Recommended pt to do annual eye exam and routine dental exams/cleanings   . Depression, alcohol, fall screening completed as documented above and per flowsheets  . Reviewed Health Maintenance: Health Maintenance  Topic Date Due  . COVID-19 Vaccine (1) Never done  . INFLUENZA VACCINE  07/06/2020 (Originally 11/07/2019)  . PAP-Cervical Cytology Screening  04/24/2021  . PAP SMEAR-Modifier  04/24/2021  . TETANUS/TDAP  01/30/2025  . Hepatitis C Screening  Completed  . HIV Screening  Completed    . Immunizations: Immunization History  Administered Date(s) Administered  . Influenza,inj,Quad PF,6+ Mos 12/05/2016  . Tdap 01/31/2015    Levy Pupa D Physician Assistant  [facility]     ICD-10-CM   1. Adult general medical exam  Z00.00 CBC with Differential/Platelet    COMPLETE METABOLIC PANEL WITH GFR    Lipid panel    TSH  2. PSVT (paroxysmal supraventricular tachycardia) (HCC) Chronic I47.1    No current symptoms, VSS, heart RRR on exam  3. Left lumbar radiculitis  M54.16    managed by ortho  4. Acute vaginitis  N76.0 Cervicovaginal ancillary only   pt noted some irritation, self swab done - tx per  results  5. Hyperlipidemia, unspecified hyperlipidemia type  E78.5    hx of, recheck labs and monitor periodically, pt young and healthy, likely will not need to tx or monitor annually for a while         Delsa Grana, PA-C 02/21/20 11:18 AM  [place]

## 2020-02-21 NOTE — Patient Instructions (Signed)
Preventive Care 78-28 Years Old, Female Preventive care refers to visits with your health care provider and lifestyle choices that can promote health and wellness. This includes:  A yearly physical exam. This may also be called an annual well check.  Regular dental visits and eye exams.  Immunizations.  Screening for certain conditions.  Healthy lifestyle choices, such as eating a healthy diet, getting regular exercise, not using drugs or products that contain nicotine and tobacco, and limiting alcohol use. What can I expect for my preventive care visit? Physical exam Your health care provider will check your:  Height and weight. This may be used to calculate body mass index (BMI), which tells if you are at a healthy weight.  Heart rate and blood pressure.  Skin for abnormal spots. Counseling Your health care provider may ask you questions about your:  Alcohol, tobacco, and drug use.  Emotional well-being.  Home and relationship well-being.  Sexual activity.  Eating habits.  Work and work Statistician.  Method of birth control.  Menstrual cycle.  Pregnancy history. What immunizations do I need?  Influenza (flu) vaccine  This is recommended every year. Tetanus, diphtheria, and pertussis (Tdap) vaccine  You may need a Td booster every 10 years. Varicella (chickenpox) vaccine  You may need this if you have not been vaccinated. Human papillomavirus (HPV) vaccine  If recommended by your health care provider, you may need three doses over 6 months. Measles, mumps, and rubella (MMR) vaccine  You may need at least one dose of MMR. You may also need a second dose. Meningococcal conjugate (MenACWY) vaccine  One dose is recommended if you are age 67-21 years and a first-year college student living in a residence hall, or if you have one of several medical conditions. You may also need additional booster doses. Pneumococcal conjugate (PCV13) vaccine  You may need  this if you have certain conditions and were not previously vaccinated. Pneumococcal polysaccharide (PPSV23) vaccine  You may need one or two doses if you smoke cigarettes or if you have certain conditions. Hepatitis A vaccine  You may need this if you have certain conditions or if you travel or work in places where you may be exposed to hepatitis A. Hepatitis B vaccine  You may need this if you have certain conditions or if you travel or work in places where you may be exposed to hepatitis B. Haemophilus influenzae type b (Hib) vaccine  You may need this if you have certain conditions. You may receive vaccines as individual doses or as more than one vaccine together in one shot (combination vaccines). Talk with your health care provider about the risks and benefits of combination vaccines. What tests do I need?  Blood tests  Lipid and cholesterol levels. These may be checked every 5 years starting at age 41.  Hepatitis C test.  Hepatitis B test. Screening  Diabetes screening. This is done by checking your blood sugar (glucose) after you have not eaten for a while (fasting).  Sexually transmitted disease (STD) testing.  BRCA-related cancer screening. This may be done if you have a family history of breast, ovarian, tubal, or peritoneal cancers.  Pelvic exam and Pap test. This may be done every 3 years starting at age 27. Starting at age 7, this may be done every 5 years if you have a Pap test in combination with an HPV test. Talk with your health care provider about your test results, treatment options, and if necessary, the need for more tests.  Follow these instructions at home: Eating and drinking   Eat a diet that includes fresh fruits and vegetables, whole grains, lean protein, and low-fat dairy.  Take vitamin and mineral supplements as recommended by your health care provider.  Do not drink alcohol if: ? Your health care provider tells you not to drink. ? You are  pregnant, may be pregnant, or are planning to become pregnant.  If you drink alcohol: ? Limit how much you have to 0-1 drink a day. ? Be aware of how much alcohol is in your drink. In the [place] one drink equals one 12 oz bottle of beer (355 mL), one 5 oz glass of wine (148 mL), or one 1 oz glass of hard liquor (44 mL). Lifestyle  Take daily care of your teeth and gums.  Stay active. Exercise for at least 30 minutes on 5 or more days each week.  Do not use any products that contain nicotine or tobacco, such as cigarettes, e-cigarettes, and chewing tobacco. If you need help quitting, ask your health care provider.  If you are sexually active, practice safe sex. Use a condom or other form of birth control (contraception) in order to prevent pregnancy and STIs (sexually transmitted infections). If you plan to become pregnant, see your health care provider for a preconception visit. What's next?  Visit your health care provider once a year for a well check visit.  Ask your health care provider how often you should have your eyes and teeth checked.  Stay up to date on all vaccines. This information is not intended to replace advice given to you by your health care provider. Make sure you discuss any questions you have with your health care provider. Document Revised: 12/04/2017 Document Reviewed: 12/04/2017 Elsevier Patient Education  2020 [employer].

## 2020-02-22 ENCOUNTER — Other Ambulatory Visit: Payer: Self-pay | Admitting: Family Medicine

## 2020-02-22 DIAGNOSIS — N760 Acute vaginitis: Secondary | ICD-10-CM

## 2020-02-22 DIAGNOSIS — E162 Hypoglycemia, unspecified: Secondary | ICD-10-CM

## 2020-02-22 DIAGNOSIS — R7989 Other specified abnormal findings of blood chemistry: Secondary | ICD-10-CM

## 2020-02-22 LAB — CERVICOVAGINAL ANCILLARY ONLY
Bacterial Vaginitis (gardnerella): POSITIVE — AB
Candida Glabrata: NEGATIVE
Candida Vaginitis: NEGATIVE
Comment: NEGATIVE
Comment: NEGATIVE
Comment: NEGATIVE

## 2020-02-22 MED ORDER — METRONIDAZOLE 500 MG PO TABS: 500.0000 mg | ORAL_TABLET | Freq: Two times a day (BID) | ORAL | 0 refills | Status: DC

## 2020-02-23 LAB — TEST AUTHORIZATION

## 2020-02-23 LAB — COMPLETE METABOLIC PANEL WITH GFR
AG Ratio: 1.5 (calc) (ref 1.0–2.5)
ALT: 21 U/L (ref 6–29)
AST: 18 U/L (ref 10–30)
Albumin: 4.5 g/dL (ref 3.6–5.1)
Alkaline phosphatase (APISO): 53 U/L (ref 31–125)
BUN: 11 mg/dL (ref 7–25)
CO2: 29 mmol/L (ref 20–32)
Calcium: 10.2 mg/dL (ref 8.6–10.2)
Chloride: 103 mmol/L (ref 98–110)
Creat: 0.87 mg/dL (ref 0.50–1.10)
GFR, Est African American: 105 mL/min/{1.73_m2} (ref >=60)
GFR, Est Non African American: 91 mL/min/{1.73_m2} (ref >=60)
Globulin: 3 g/dL (calc) (ref 1.9–3.7)
Glucose, Bld: 64 mg/dL — ABNORMAL LOW (ref 65–139)
Potassium: 4.2 mmol/L (ref 3.5–5.3)
Sodium: 139 mmol/L (ref 135–146)
Total Bilirubin: 0.5 mg/dL (ref 0.2–1.2)
Total Protein: 7.5 g/dL (ref 6.1–8.1)

## 2020-02-23 LAB — CBC WITH DIFFERENTIAL/PLATELET
Absolute Monocytes: 372 cells/uL (ref 200–950)
Basophils Absolute: 42 cells/uL (ref 0–200)
Basophils Relative: 1.1 %
Eosinophils Absolute: 91 cells/uL (ref 15–500)
Eosinophils Relative: 2.4 %
HCT: 43 % (ref 35.0–45.0)
Hemoglobin: 14.1 g/dL (ref 11.7–15.5)
Lymphs Abs: 1904 cells/uL (ref 850–3900)
MCH: 29 pg (ref 27.0–33.0)
MCHC: 32.8 g/dL (ref 32.0–36.0)
MCV: 88.5 fL (ref 80.0–100.0)
MPV: 10.6 fL (ref 7.5–12.5)
Monocytes Relative: 9.8 %
Neutro Abs: 1391 cells/uL — ABNORMAL LOW (ref 1500–7800)
Neutrophils Relative %: 36.6 %
Platelets: 284 10*3/uL (ref 140–400)
RBC: 4.86 10*6/uL (ref 3.80–5.10)
RDW: 12.2 % (ref 11.0–15.0)
Total Lymphocyte: 50.1 %
WBC: 3.8 10*3/uL (ref 3.8–10.8)

## 2020-02-23 LAB — T4, FREE: Free T4: 1.2 ng/dL (ref 0.8–1.8)

## 2020-02-23 LAB — LIPID PANEL
Cholesterol: 173 mg/dL (ref <200)
HDL: 35 mg/dL — ABNORMAL LOW (ref >=50)
LDL Cholesterol (Calc): 114 mg/dL (calc) — ABNORMAL HIGH
Non-HDL Cholesterol (Calc): 138 mg/dL (calc) — ABNORMAL HIGH (ref <130)
Total CHOL/HDL Ratio: 4.9 (calc) (ref <5.0)
Triglycerides: 129 mg/dL (ref <150)

## 2020-02-23 LAB — TSH: TSH: 0.2 mIU/L — ABNORMAL LOW

## 2020-02-24 ENCOUNTER — Telehealth: Payer: Self-pay

## 2020-02-24 DIAGNOSIS — E162 Hypoglycemia, unspecified: Secondary | ICD-10-CM

## 2020-02-24 NOTE — Telephone Encounter (Signed)
Patient given results as noted by Delsa Grana, PA-C on 02/22/20, patient verbalized understanding. Advised low sugar symptoms, she asked what can she do to keep it up. Advised as below. She says she would like a glucose meter. No f/u available with Dr. Ancil Boozer. She says she wants to see any provider about her back pain. Appointment scheduled for next Wednesday with Leisa.       Delsa Grana, PA-C  02/22/2020 5:34 PM EST     Positive for BV, neg for yeast - I'll send in flagyl to treat - can stop over the counter monistat (if she was still trying that)  Blood sugar was a little low - please review with pt what low sugar episodes can feel like (handout on your desk) and encourage pt to have snacks available - eat complex carbs and food with protein to avoid frequent lows  Her thyroid lab was a little low - I will have to add on labs (I will order helen if you could f/up and see if the lab is able to add them on to tubes - if not pt will need to come back to get done) add free T4 - ordered - please have her get f/up appt with PCP to address abnormal labs/dx at some point  Cholesterol was still a little elevated - avoid trans fat and saturated fat in diet  If she wants a glucometer let me know - we may be able to get that ordered with multiple hypoglycemic readings She can also come by here and do POC glucose if she wants to recheck it  Other labs were normal, normal kidney and liver function, no anemia

## 2020-02-28 MED ORDER — BLOOD GLUCOSE MONITOR KIT: PACK | 0 refills | Status: DC

## 2020-02-28 NOTE — Addendum Note (Signed)
Addended by: Delsa Grana on: 02/28/2020 02:42 PM   Modules accepted: Orders

## 2020-02-29 NOTE — Progress Notes (Signed)
Name: Michele [name]   MRN: [id number]    DOB: 08/12/1990   Date:03/01/2020       Progress Note  Subjective  Chief Complaint  Follow up Back pain  HPI   Snoring: she states she noticed memory changes - difficulty remembering things and now more difficulty when studying. She states initially she blamed the fact that she goes to school full time and has children, but symptoms are getting worse , she used to do this as a single mother without problems. She snores at night, wakes up feeling tired. She goes to bed around 10 pm and wakes up around 5:30 am . She has her tonsils and adenoids. We placed a referral for sleepy study back in September but she did not get a phone call, we will give her the phone number and she will call them back  Shaking feeling: usually happens after she eats, about one hours later, she develops headaches and shakes, discussed high protein diet and avoid carbs. Her last glucose non fasting was down to 64, explained likely symptoms from hypoglycemia. 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 She was recently seen at [facility] in [place] and was advised either epidural injections or therapy. She is taking 18 credit hours for school and does not have much time, she will contact them back for therapy. Muscle relaxer caused sedation and affects her ability to do her school work  We will try adding Lyrica at night and can consider BID if it does not make her sleepy   Dysthymia/Anxiety: she is overwhelmed, taking 18 credit hours at school, also has two children, planning her wedding July 2022, feels anxious and nervous all the time, difficulty focusing, and lately sleeping too much, feels like she cannot do anything. Discussed therapy and medication. She states she does not like taking medication  Low TSH:  recheck in 6 weeks, discussed sub-clinical hypothyroidism with patient   Patient Active Problem List   Diagnosis Date Noted  . PSVT (paroxysmal supraventricular tachycardia) ([place]) 01/18/2019  . Memory changes 04/24/2018  . Left lumbar radiculitis 04/24/2018    Past Surgical History:  Procedure Laterality Date  . LIPOSUCTION AUOLOGOUS FAT TRANSFER TO BUTTOCKS  03/25/2019   in [place], Dr. Lynann Bologna   . NECK SURGERY      Family History  Problem Relation Age of Onset  . Hypertension Mother   . Allergic rhinitis Mother   . Hypertension Father   . Asthma Sister   . Allergic rhinitis Sister   . Dementia Paternal Grandmother   . Eczema Neg Hx   . Urticaria Neg Hx     Social History   Tobacco Use  . Smoking status: Never Smoker  . Smokeless tobacco: Never Used  Substance Use Topics  . Alcohol use: Never     Current Outpatient Medications:  .  blood glucose meter kit and supplies KIT, Dispense based on patient and insurance preference. Use up to four times daily as directed. (FOR E16.2), Disp: 1 each, Rfl: 0 .  metroNIDAZOLE (FLAGYL) 500 MG tablet, Take 1 tablet (500 mg total) by mouth 2 (two) times daily., Disp: 14 tablet, Rfl: 0  Allergies  Allergen Reactions  . Codeine     I personally reviewed active problem list, medication list, allergies, family history,  social history, health maintenance with the patient/caregiver today.   ROS  Constitutional: Negative for fever or weight change.  Respiratory: Negative for cough and shortness of breath.   Cardiovascular: Negative for chest pain or palpitations.  Gastrointestinal: Negative for abdominal pain, no bowel changes.  Musculoskeletal: Negative for gait problem or joint swelling.  Skin: Negative for rash.  Neurological: Negative for dizziness or headache.  No other specific complaints in a complete review of systems (except as listed in HPI above).  Objective  Vitals:   03/01/20 0902  BP: 126/82  Pulse: 84  Resp: 14   Temp: 98.3 F (36.8 C)  TempSrc: Oral  SpO2: 98%  Weight: 169 lb 6.4 oz (76.8 kg)  Height: $Remove'5\' 2"'cOkoecl$  (1.575 m)    Body mass index is 30.98 kg/m.  Physical Exam  Constitutional: Patient appears well-developed and well-nourished. Obese  No distress.  HEENT: head atraumatic, normocephalic, pupils equal and reactive to light,  neck supple, no thyromegaly  Cardiovascular: Normal rate, regular rhythm and normal heart sounds.  No murmur heard. No BLE edema. Pulmonary/Chest: Effort normal and breath sounds normal. No respiratory distress. Abdominal: Soft.  There is no tenderness. Muscular Skeletal: no pain during palpation of lumbar spine, normal rom, positive straight leg raise Psychiatric: Patient has a normal mood and affect. behavior is normal. Judgment and thought content normal.  Recent Results (from the past 2160 hour(s))  Cervicovaginal ancillary only     Status: Abnormal   Collection Time: 02/21/20 11:31 AM  Result Value Ref Range   Candida Vaginitis Negative    Candida Glabrata Negative    Bacterial Vaginitis (gardnerella) Positive (A)    Comment Normal Reference Range Candida Species - Negative    Comment Normal Reference Range Candida Galbrata - Negative    Comment      Normal Reference Range Bacterial Vaginosis - Negative  CBC with Differential/Platelet     Status: Abnormal   Collection Time: 02/21/20 11:46 AM  Result Value Ref Range   WBC 3.8 3.8 - 10.8 Thousand/uL   RBC 4.86 3.80 - 5.10 Million/uL   Hemoglobin 14.1 11.7 - 15.5 g/dL   HCT 43.0 35 - 45 %   MCV 88.5 80.0 - 100.0 fL   MCH 29.0 27.0 - 33.0 pg   MCHC 32.8 32.0 - 36.0 g/dL   RDW 12.2 11.0 - 15.0 %   Platelets 284 140 - 400 Thousand/uL   MPV 10.6 7.5 - 12.5 fL   Neutro Abs 1,391 (L) 1,500 - 7,800 cells/uL   Lymphs Abs 1,904 850 - 3,900 cells/uL   Absolute Monocytes 372 200 - 950 cells/uL   Eosinophils Absolute 91 15.0 - 500.0 cells/uL   Basophils Absolute 42 0.0 - 200.0 cells/uL   Neutrophils Relative %  36.6 %   Total Lymphocyte 50.1 %   Monocytes Relative 9.8 %   Eosinophils Relative 2.4 %   Basophils Relative 1.1 %  COMPLETE METABOLIC PANEL WITH GFR     Status: Abnormal   Collection Time: 02/21/20 11:46 AM  Result Value Ref Range   Glucose, Bld 64 (L) 65 - 139 mg/dL    Comment: .        Non-fasting reference interval .    BUN 11 7 - 25 mg/dL   Creat 0.87 0.50 - 1.10 mg/dL   GFR, Est Non African American 91 > OR = 60 mL/min/1.10m2   GFR, Est African American 105 > OR = 60 mL/min/1.58m2   BUN/Creatinine Ratio NOT APPLICABLE 6 - 22 (  calc)   Sodium 139 135 - 146 mmol/L   Potassium 4.2 3.5 - 5.3 mmol/L   Chloride 103 98 - 110 mmol/L   CO2 29 20 - 32 mmol/L   Calcium 10.2 8.6 - 10.2 mg/dL   Total Protein 7.5 6.1 - 8.1 g/dL   Albumin 4.5 3.6 - 5.1 g/dL   Globulin 3.0 1.9 - 3.7 g/dL (calc)   AG Ratio 1.5 1.0 - 2.5 (calc)   Total Bilirubin 0.5 0.2 - 1.2 mg/dL   Alkaline phosphatase (APISO) 53 31 - 125 U/L   AST 18 10 - 30 U/L   ALT 21 6 - 29 U/L  Lipid panel     Status: Abnormal   Collection Time: 02/21/20 11:46 AM  Result Value Ref Range   Cholesterol 173 <200 mg/dL   HDL 35 (L) > OR = 50 mg/dL   Triglycerides 129 <150 mg/dL   LDL Cholesterol (Calc) 114 (H) mg/dL (calc)    Comment: Reference range: <100 . Desirable range <100 mg/dL for primary prevention;   <70 mg/dL for patients with CHD or diabetic patients  with > or = 2 CHD risk factors. Marland Kitchen LDL-C is now calculated using the Martin-Hopkins  calculation, which is a validated novel method providing  better accuracy than the Friedewald equation in the  estimation of LDL-C.  Cresenciano Genre et al. Annamaria Helling. 3491;791(50): 2061-2068  (http://education.[facility].com/faq/FAQ164)    Total CHOL/HDL Ratio 4.9 <5.0 (calc)   Non-HDL Cholesterol (Calc) 138 (H) <130 mg/dL (calc)    Comment: For patients with diabetes plus 1 major ASCVD risk  factor, treating to a non-HDL-C goal of <100 mg/dL  (LDL-C of <70 mg/dL) is considered a  therapeutic  option.   TSH     Status: Abnormal   Collection Time: 02/21/20 11:46 AM  Result Value Ref Range   TSH 0.20 (L) mIU/L    Comment:           Reference Range .           > or = 20 Years  0.40-4.50 .                Pregnancy Ranges           First trimester    0.26-2.66           Second trimester   0.55-2.73           Third trimester    0.43-2.91   T4, free     Status: None   Collection Time: 02/21/20 11:46 AM  Result Value Ref Range   Free T4 1.2 0.8 - 1.8 ng/dL  TEST AUTHORIZATION     Status: None   Collection Time: 02/21/20 11:46 AM  Result Value Ref Range   TEST NAME: T4, FREE    TEST CODE: [id number]    CLIENT CONTACT: LESLEY SMITH     REPORT ALWAYS MESSAGE SIGNATURE      Comment: . The laboratory testing on this patient was verbally requested or confirmed by the ordering physician or his or her authorized representative after contact with an employee of [employer]. Federal regulations require that we maintain on file written authorization for all laboratory testing.  Accordingly we are asking that the ordering physician or his or her authorized representative sign a copy of this report and promptly return it to the client service representative. . . Signature:____________________________________________________ . Please fax this signed page to [phone] or return it via your [employer] courier.  PHQ2/9: Depression screen [facility] 2/9 03/01/2020 02/21/2020 12/22/2019 07/15/2019 03/15/2019  Decreased Interest 2 1 0 0 0  Down, Depressed, Hopeless 2 1 0 0 0  PHQ - 2 Score 4 2 0 0 0  Altered sleeping 2 - - 0 0  Tired, decreased energy 3 - - 0 0  Change in appetite 3 - - 0 0  Feeling bad or failure about yourself  1 - - 0 0  Trouble concentrating 3 - - 0 0  Moving slowly or fidgety/restless 3 - - 0 0  Suicidal thoughts 0 - - 0 0  PHQ-9 Score 19 - - 0 0  Difficult doing work/chores Very difficult - - Not difficult at all Not difficult at  all    phq 9 is positive  GAD 7 : Generalized Anxiety Score 03/01/2020  Nervous, Anxious, on Edge 3  Control/stop worrying 3  Worry too much - different things 3  Trouble relaxing 2  Restless 2  Easily annoyed or irritable 2  Afraid - awful might happen 1  Total GAD 7 Score 16  Anxiety Difficulty Very difficult     Fall Risk: Fall Risk  03/01/2020 02/21/2020 12/22/2019 07/15/2019 03/15/2019  Falls in the past year? 0 0 0 0 0  Number falls in past yr: 0 0 0 0 0  Injury with Fall? 0 0 0 0 0  Follow up - - - Falls evaluation completed Falls evaluation completed     Functional Status Survey: Is the patient deaf or have difficulty hearing?: No Does the patient have difficulty seeing, even when wearing glasses/contacts?: No Does the patient have difficulty concentrating, remembering, or making decisions?: Yes Does the patient have difficulty walking or climbing stairs?: No Does the patient have difficulty dressing or bathing?: No Does the patient have difficulty doing errands alone such as visiting a doctor's office or shopping?: No    Assessment & Plan  1. Hypoglycemia   2. Low TSH level   3. Dysthymia  - DULoxetine (CYMBALTA) 30 MG capsule; Take 1-2 capsules (30-60 mg total) by mouth daily.  Dispense: 60 capsule; Refill: 0  4. GAD (generalized anxiety disorder)  - DULoxetine (CYMBALTA) 30 MG capsule; Take 1-2 capsules (30-60 mg total) by mouth daily.  Dispense: 60 capsule; Refill: 0  5. Chronic bilateral low back pain with left-sided sciatica  - DULoxetine (CYMBALTA) 30 MG capsule; Take 1-2 capsules (30-60 mg total) by mouth daily.  Dispense: 60 capsule; Refill: 0 - pregabalin (LYRICA) 50 MG capsule; Take 1-3 capsules (50-150 mg total) by mouth 2 (two) times daily.  Dispense: 90 capsule; Refill: 0

## 2020-02-29 NOTE — Telephone Encounter (Signed)
Faxed to [facility] [facility]

## 2020-03-01 ENCOUNTER — Other Ambulatory Visit: Payer: Self-pay

## 2020-03-01 ENCOUNTER — Encounter: Payer: Self-pay | Admitting: Family Medicine

## 2020-03-01 ENCOUNTER — Ambulatory Visit: Payer: Medicaid Other | Admitting: Family Medicine

## 2020-03-01 VITALS — BP 126/82 | HR 84 | Temp 98.3°F | Resp 14 | Ht 62.0 in | Wt 169.4 lb

## 2020-03-01 DIAGNOSIS — E162 Hypoglycemia, unspecified: Secondary | ICD-10-CM | POA: Diagnosis not present

## 2020-03-01 DIAGNOSIS — F411 Generalized anxiety disorder: Secondary | ICD-10-CM

## 2020-03-01 DIAGNOSIS — R7989 Other specified abnormal findings of blood chemistry: Secondary | ICD-10-CM

## 2020-03-01 DIAGNOSIS — G8929 Other chronic pain: Secondary | ICD-10-CM

## 2020-03-01 DIAGNOSIS — F341 Dysthymic disorder: Secondary | ICD-10-CM

## 2020-03-01 DIAGNOSIS — M5442 Lumbago with sciatica, left side: Secondary | ICD-10-CM

## 2020-03-01 MED ORDER — DULOXETINE HCL 30 MG PO CPEP: 30.0000 mg | ORAL_CAPSULE | Freq: Every day | ORAL | 0 refills | Status: DC

## 2020-03-01 MED ORDER — PREGABALIN 50 MG PO CAPS: 50.0000 mg | ORAL_CAPSULE | Freq: Two times a day (BID) | ORAL | 0 refills | Status: DC

## 2020-03-01 NOTE — Patient Instructions (Addendum)
[facility] [place]  Start Lyrica ( pregablin ) 50 mg at night and go up to 150 as tolerated, you can also try taking in the mornings if it does not make you sleep   Duloxetine ( for anxiety and pain) take in the morning with food, and go up to 2 daily after one week

## 2020-03-28 NOTE — Progress Notes (Deleted)
Name: Michele [name]   MRN: [id number]    DOB: 18-Nov-1990   Date:03/28/2020       Progress Note  Subjective  Chief Complaint  Follow Up  HPI  Snoring: she states she noticed memory changes - difficulty remembering things and now more difficulty when studying. She states initially she blamed the fact that she goes to school full time and has children, but symptoms are getting worse , she used to do this as a single mother without problems. She snores at night, wakes up feeling tired. She goes to bed around 10 pm and wakes up around 5:30 am . She has her tonsils and adenoids. We placed a referral for sleepy study back in September but she did not get a phone call, we will give her the phone number and she will call them back  Shaking feeling: usually happens after she eats, about one hours later, she develops headaches and shakes, discussed high protein diet and avoid carbs. Her last glucose non fasting was down to 64, explained likely symptoms from hypoglycemia. 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 She was recently seen at [facility] in [place] and was advised either epidural injections or therapy. She is taking 18 credit hours for school and does not have much time, she will contact them back for therapy. Muscle relaxer caused sedation and affects her ability to do her school work  We will try adding Lyrica at night and can consider BID if it does not make her sleepy   Dysthymia/Anxiety: she is overwhelmed, taking 18 credit hours at school, also has two children, planning her wedding July 2022, feels anxious and nervous all the time, difficulty focusing, and lately sleeping too much, feels like she cannot do anything. Discussed therapy and medication. She states she does not like taking medication  Low TSH: recheck in 6  weeks, discussed sub-clinical hypothyroidism with patient  Patient Active Problem List   Diagnosis Date Noted  . PSVT (paroxysmal supraventricular tachycardia) ([place]) 01/18/2019  . Memory changes 04/24/2018  . Left lumbar radiculitis 04/24/2018    Past Surgical History:  Procedure Laterality Date  . LIPOSUCTION AUOLOGOUS FAT TRANSFER TO BUTTOCKS  03/25/2019   in [place], Dr. Lynann Bologna   . NECK SURGERY      Family History  Problem Relation Age of Onset  . Hypertension Mother   . Allergic rhinitis Mother   . Hypertension Father   . Asthma Sister   . Allergic rhinitis Sister   . Dementia Paternal Grandmother   . Eczema Neg Hx   . Urticaria Neg Hx     Social History   Tobacco Use  . Smoking status: Never Smoker  . Smokeless tobacco: Never Used  Substance Use Topics  . Alcohol use: Never     Current Outpatient Medications:  .  blood glucose meter kit and supplies KIT, Dispense based on patient and insurance preference. Use up to four times daily as directed. (FOR E16.2), Disp: 1 each, Rfl: 0 .  DULoxetine (CYMBALTA) 30 MG capsule, Take 1-2 capsules (30-60 mg total) by mouth daily., Disp: 60 capsule, Rfl: 0 .  pregabalin (LYRICA) 50 MG capsule, Take 1-3 capsules (50-150 mg total) by mouth 2 (two) times daily., Disp: 90 capsule, Rfl: 0  Allergies  Allergen Reactions  .  Codeine     I personally reviewed {Reviewed:14835} with the patient/caregiver today.   ROS  ***  Objective  There were no vitals filed for this visit.  There is no height or weight on file to calculate BMI.  Physical Exam ***  Recent Results (from the past 2160 hour(s))  Cervicovaginal ancillary only     Status: Abnormal   Collection Time: 02/21/20 11:31 AM  Result Value Ref Range   Candida Vaginitis Negative    Candida Glabrata Negative    Bacterial Vaginitis (gardnerella) Positive (A)    Comment Normal Reference Range Candida Species - Negative    Comment Normal Reference Range Candida  Galbrata - Negative    Comment      Normal Reference Range Bacterial Vaginosis - Negative  CBC with Differential/Platelet     Status: Abnormal   Collection Time: 02/21/20 11:46 AM  Result Value Ref Range   WBC 3.8 3.8 - 10.8 Thousand/uL   RBC 4.86 3.80 - 5.10 Million/uL   Hemoglobin 14.1 11.7 - 15.5 g/dL   HCT 43.0 35.0 - 45.0 %   MCV 88.5 80.0 - 100.0 fL   MCH 29.0 27.0 - 33.0 pg   MCHC 32.8 32.0 - 36.0 g/dL   RDW 12.2 11.0 - 15.0 %   Platelets 284 140 - 400 Thousand/uL   MPV 10.6 7.5 - 12.5 fL   Neutro Abs 1,391 (L) 1,500 - 7,800 cells/uL   Lymphs Abs 1,904 850 - 3,900 cells/uL   Absolute Monocytes 372 200 - 950 cells/uL   Eosinophils Absolute 91 15 - 500 cells/uL   Basophils Absolute 42 0 - 200 cells/uL   Neutrophils Relative % 36.6 %   Total Lymphocyte 50.1 %   Monocytes Relative 9.8 %   Eosinophils Relative 2.4 %   Basophils Relative 1.1 %  COMPLETE METABOLIC PANEL WITH GFR     Status: Abnormal   Collection Time: 02/21/20 11:46 AM  Result Value Ref Range   Glucose, Bld 64 (L) 65 - 139 mg/dL    Comment: .        Non-fasting reference interval .    BUN 11 7 - 25 mg/dL   Creat 0.87 0.50 - 1.10 mg/dL   GFR, Est Non African American 91 > OR = 60 mL/min/1.12m   GFR, Est African American 105 > OR = 60 mL/min/1.743m  BUN/Creatinine Ratio NOT APPLICABLE 6 - 22 (calc)   Sodium 139 135 - 146 mmol/L   Potassium 4.2 3.5 - 5.3 mmol/L   Chloride 103 98 - 110 mmol/L   CO2 29 20 - 32 mmol/L   Calcium 10.2 8.6 - 10.2 mg/dL   Total Protein 7.5 6.1 - 8.1 g/dL   Albumin 4.5 3.6 - 5.1 g/dL   Globulin 3.0 1.9 - 3.7 g/dL (calc)   AG Ratio 1.5 1.0 - 2.5 (calc)   Total Bilirubin 0.5 0.2 - 1.2 mg/dL   Alkaline phosphatase (APISO) 53 31 - 125 U/L   AST 18 10 - 30 U/L   ALT 21 6 - 29 U/L  Lipid panel     Status: Abnormal   Collection Time: 02/21/20 11:46 AM  Result Value Ref Range   Cholesterol 173 <200 mg/dL   HDL 35 (L) > OR = 50 mg/dL   Triglycerides 129 <150 mg/dL   LDL  Cholesterol (Calc) 114 (H) mg/dL (calc)    Comment: Reference range: <100 . Desirable range <100 mg/dL for primary prevention;   <70 mg/dL for patients with CHD  or diabetic patients  with > or = 2 CHD risk factors. Marland Kitchen LDL-C is now calculated using the Martin-Hopkins  calculation, which is a validated novel method providing  better accuracy than the Friedewald equation in the  estimation of LDL-C.  Cresenciano Genre et al. Annamaria Helling. 2694;854(62): 2061-2068  (http://education.[facility].com/faq/FAQ164)    Total CHOL/HDL Ratio 4.9 <5.0 (calc)   Non-HDL Cholesterol (Calc) 138 (H) <130 mg/dL (calc)    Comment: For patients with diabetes plus 1 major ASCVD risk  factor, treating to a non-HDL-C goal of <100 mg/dL  (LDL-C of <70 mg/dL) is considered a therapeutic  option.   TSH     Status: Abnormal   Collection Time: 02/21/20 11:46 AM  Result Value Ref Range   TSH 0.20 (L) mIU/L    Comment:           Reference Range .           > or = 20 Years  0.40-4.50 .                Pregnancy Ranges           First trimester    0.26-2.66           Second trimester   0.55-2.73           Third trimester    0.43-2.91   T4, free     Status: None   Collection Time: 02/21/20 11:46 AM  Result Value Ref Range   Free T4 1.2 0.8 - 1.8 ng/dL  TEST AUTHORIZATION     Status: None   Collection Time: 02/21/20 11:46 AM  Result Value Ref Range   TEST NAME: T4, FREE    TEST CODE: [id number]    CLIENT CONTACT: LESLEY SMITH     REPORT ALWAYS MESSAGE SIGNATURE      Comment: . The laboratory testing on this patient was verbally requested or confirmed by the ordering physician or his or her authorized representative after contact with an employee of [employer]. Federal regulations require that we maintain on file written authorization for all laboratory testing.  Accordingly we are asking that the ordering physician or his or her authorized representative sign a copy of this report and promptly return it to  the client service representative. . . Signature:____________________________________________________ . Please fax this signed page to [phone] or return it via your [employer] courier.     Diabetic Foot Exam: Diabetic Foot Exam - Simple   No data filed    ***  PHQ2/9: Depression screen [facility] 2/9 03/01/2020 02/21/2020 12/22/2019 07/15/2019 03/15/2019  Decreased Interest 2 1 0 0 0  Down, Depressed, Hopeless 2 1 0 0 0  PHQ - 2 Score 4 2 0 0 0  Altered sleeping 2 - - 0 0  Tired, decreased energy 3 - - 0 0  Change in appetite 3 - - 0 0  Feeling bad or failure about yourself  1 - - 0 0  Trouble concentrating 3 - - 0 0  Moving slowly or fidgety/restless 3 - - 0 0  Suicidal thoughts 0 - - 0 0  PHQ-9 Score 19 - - 0 0  Difficult doing work/chores Very difficult - - Not difficult at all Not difficult at all    phq 9 is {gen pos WEX:937169} ***  Fall Risk: Fall Risk  03/01/2020 02/21/2020 12/22/2019 07/15/2019 03/15/2019  Falls in the past year? 0 0 0 0 0  Number falls in past yr: 0 0 0 0  0  Injury with Fall? 0 0 0 0 0  Follow up - - - Falls evaluation completed Falls evaluation completed   ***   Functional Status Survey:   ***   Assessment & Plan  *** There are no diagnoses linked to this encounter.

## 2020-03-29 ENCOUNTER — Ambulatory Visit: Payer: Medicaid Other | Admitting: Family Medicine

## 2020-04-17 ENCOUNTER — Other Ambulatory Visit: Payer: Self-pay | Admitting: Family Medicine

## 2020-04-17 DIAGNOSIS — M5442 Lumbago with sciatica, left side: Secondary | ICD-10-CM

## 2020-04-17 DIAGNOSIS — F411 Generalized anxiety disorder: Secondary | ICD-10-CM

## 2020-04-17 DIAGNOSIS — G8929 Other chronic pain: Secondary | ICD-10-CM

## 2020-04-17 DIAGNOSIS — F341 Dysthymic disorder: Secondary | ICD-10-CM

## 2020-04-17 NOTE — Telephone Encounter (Signed)
Requested medication (s) are due for refill today: yes  Requested medication (s) are on the active medication list: yes  Last refill:  03/01/2020  Future visit scheduled: yes  Notes to clinic: this refill cannot be delegated    Requested Prescriptions  Pending Prescriptions Disp Refills   pregabalin (LYRICA) 50 MG capsule [Pharmacy Med Name: PREGABALIN 50MG  CAPSULES] 90 capsule     Sig: TAKE 1 TO 3 CAPSULES(50 TO 150 MG) BY MOUTH TWICE DAILY      Not Delegated - Neurology:  Anticonvulsants - Controlled Failed - 04/17/2020  2:16 PM      Failed - This refill cannot be delegated      Passed - Valid encounter within last 12 months    Recent Outpatient Visits           1 month ago Hypoglycemia   [facility] Steele Sizer, MD   1 month ago Adult general medical exam   [facility] Delsa Grana, PA-C   3 months ago Hypoglycemia   [facility] Steele Sizer, MD   8 months ago No-show for appointment   [facility] Steele Sizer, MD   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   [facility] Steele Sizer, MD       Future Appointments             In 2 weeks Steele Sizer, MD [facility], [place]   In 10 months Steele Sizer, MD [facility]              Signed Prescriptions Disp Refills   DULoxetine (CYMBALTA) 30 MG capsule 60 capsule 0    Sig: TAKE 1 TO 2 CAPSULES(30 TO 60 MG) BY MOUTH DAILY      Psychiatry: Antidepressants - SNRI Passed - 04/17/2020  2:16 PM      Passed - Last BP in normal range    BP Readings from Last 1 Encounters:  03/01/20 126/82          Passed - Valid encounter within last 6 months    Recent Outpatient Visits           1 month ago Hypoglycemia   [facility] Steele Sizer, MD   1 month ago Adult general medical exam   [facility] Delsa Grana, PA-C   3  months ago Hypoglycemia   [facility] Steele Sizer, MD   8 months ago No-show for appointment   [facility] Steele Sizer, MD   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

## 2020-04-21 ENCOUNTER — Other Ambulatory Visit: Payer: Self-pay | Admitting: Family Medicine

## 2020-04-21 DIAGNOSIS — G8929 Other chronic pain: Secondary | ICD-10-CM

## 2020-04-21 MED ORDER — PREGABALIN 50 MG PO CAPS: 50.0000 mg | ORAL_CAPSULE | Freq: Two times a day (BID) | ORAL | 0 refills | Status: DC

## 2020-05-01 DIAGNOSIS — M961 Postlaminectomy syndrome, not elsewhere classified: Secondary | ICD-10-CM | POA: Insufficient documentation

## 2020-05-02 NOTE — Progress Notes (Deleted)
Name: Michele [name]   MRN: [id number]    DOB: [birth year]   Date:05/02/2020       Progress Note  Subjective  Chief Complaint  Follow up   HPI Snoring: she states she noticed memory changes - difficulty remembering things and now more difficulty when studying. She states initially she blamed the fact that she goes to school full time and has children, but symptoms are getting worse , she used to do this as a single mother without problems. She snores at night, wakes up feeling tired. She goes to bed around 10 pm and wakes up around 5:30 am . She has her tonsils and adenoids. We placed a referral for sleepy study back in September but she did not get a phone call, we will give her the phone number and she will call them back  Shaking feeling: usually happens after she eats, about one hours later, she develops headaches and shakes, discussed high protein diet and avoid carbs. Her last glucose non fasting was down to 64, explained likely symptoms from hypoglycemia. 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 She was recently seen at [facility] in [place] and was advised either epidural injections or therapy. She is taking 18 credit hours for school and does not have much time, she will contact them back for therapy. Muscle relaxer caused sedation and affects her ability to do her school work  We will try adding Lyrica at night and can consider BID if it does not make her sleepy   Dysthymia/Anxiety: she is overwhelmed, taking 18 credit hours at school, also has two children, planning her wedding July 2022, feels anxious and nervous all the time, difficulty focusing, and lately sleeping too much, feels like she cannot do anything. Discussed therapy and medication. She states she does not like taking medication  Low TSH: recheck in 6  weeks, discussed sub-clinical hypothyroidism with patient  *** Patient Active Problem List   Diagnosis Date Noted  . PSVT (paroxysmal supraventricular tachycardia) ([place]) 01/18/2019  . Memory changes 04/24/2018  . Left lumbar radiculitis 04/24/2018    Past Surgical History:  Procedure Laterality Date  . LIPOSUCTION AUOLOGOUS FAT TRANSFER TO BUTTOCKS  03/25/2019   in [place], Dr. Lynann Bologna   . NECK SURGERY      Family History  Problem Relation Age of Onset  . Hypertension Mother   . Allergic rhinitis Mother   . Hypertension Father   . Asthma Sister   . Allergic rhinitis Sister   . Dementia Paternal Grandmother   . Eczema Neg Hx   . Urticaria Neg Hx     Social History   Tobacco Use  . Smoking status: Never Smoker  . Smokeless tobacco: Never Used  Substance Use Topics  . Alcohol use: Never     Current Outpatient Medications:  .  ACCU-CHEK GUIDE test strip, USE UP TO FOUR TIMES DAILY, Disp: , Rfl:  .  Accu-Chek Softclix Lancets lancets, SMARTSIG:Topical 1 to 4 Times Daily, Disp: , Rfl:  .  blood glucose meter kit and supplies KIT, Dispense based on patient and insurance preference. Use up to four times daily as directed. (FOR E16.2), Disp: 1 each, Rfl: 0 .  DULoxetine (CYMBALTA) 30 MG capsule, TAKE 1 TO 2 CAPSULES(30 TO 60 MG) BY MOUTH DAILY,  Disp: 60 capsule, Rfl: 0 .  famotidine (PEPCID) 20 MG tablet, famotidine 20 mg tablet  TAKE 1 TABLET BY MOUTH AFTER SUPPER, Disp: , Rfl:  .  pregabalin (LYRICA) 50 MG capsule, Take 1-3 capsules (50-150 mg total) by mouth 2 (two) times daily., Disp: 60 capsule, Rfl: 0  Allergies  Allergen Reactions  . Codeine     I personally reviewed {Reviewed:14835} with the patient/caregiver today.   ROS  ***  Objective  There were no vitals filed for this visit.  There is no height or weight on file to calculate BMI.  Physical Exam ***  Recent Results (from the past 2160 hour(s))  Cervicovaginal ancillary only     Status: Abnormal    Collection Time: 02/21/20 11:31 AM  Result Value Ref Range   Candida Vaginitis Negative    Candida Glabrata Negative    Bacterial Vaginitis (gardnerella) Positive (A)    Comment Normal Reference Range Candida Species - Negative    Comment Normal Reference Range Candida Galbrata - Negative    Comment      Normal Reference Range Bacterial Vaginosis - Negative  CBC with Differential/Platelet     Status: Abnormal   Collection Time: 02/21/20 11:46 AM  Result Value Ref Range   WBC 3.8 3.8 - 10.8 Thousand/uL   RBC 4.86 3.80 - 5.10 Million/uL   Hemoglobin 14.1 11.7 - 15.5 g/dL   HCT 43.0 35.0 - 45.0 %   MCV 88.5 80.0 - 100.0 fL   MCH 29.0 27.0 - 33.0 pg   MCHC 32.8 32.0 - 36.0 g/dL   RDW 12.2 11.0 - 15.0 %   Platelets 284 140 - 400 Thousand/uL   MPV 10.6 7.5 - 12.5 fL   Neutro Abs 1,391 (L) 1,500 - 7,800 cells/uL   Lymphs Abs 1,904 850 - 3,900 cells/uL   Absolute Monocytes 372 200 - 950 cells/uL   Eosinophils Absolute 91 15 - 500 cells/uL   Basophils Absolute 42 0 - 200 cells/uL   Neutrophils Relative % 36.6 %   Total Lymphocyte 50.1 %   Monocytes Relative 9.8 %   Eosinophils Relative 2.4 %   Basophils Relative 1.1 %  COMPLETE METABOLIC PANEL WITH GFR     Status: Abnormal   Collection Time: 02/21/20 11:46 AM  Result Value Ref Range   Glucose, Bld 64 (L) 65 - 139 mg/dL    Comment: .        Non-fasting reference interval .    BUN 11 7 - 25 mg/dL   Creat 0.87 0.50 - 1.10 mg/dL   GFR, Est Non African American 91 > OR = 60 mL/min/1.51m   GFR, Est African American 105 > OR = 60 mL/min/1.745m  BUN/Creatinine Ratio NOT APPLICABLE 6 - 22 (calc)   Sodium 139 135 - 146 mmol/L   Potassium 4.2 3.5 - 5.3 mmol/L   Chloride 103 98 - 110 mmol/L   CO2 29 20 - 32 mmol/L   Calcium 10.2 8.6 - 10.2 mg/dL   Total Protein 7.5 6.1 - 8.1 g/dL   Albumin 4.5 3.6 - 5.1 g/dL   Globulin 3.0 1.9 - 3.7 g/dL (calc)   AG Ratio 1.5 1.0 - 2.5 (calc)   Total Bilirubin 0.5 0.2 - 1.2 mg/dL   Alkaline  phosphatase (APISO) 53 31 - 125 U/L   AST 18 10 - 30 U/L   ALT 21 6 - 29 U/L  Lipid panel     Status: Abnormal   Collection Time: 02/21/20 11:46 AM  Result Value Ref Range   Cholesterol 173 <200 mg/dL   HDL 35 (L) > OR = 50 mg/dL   Triglycerides 129 <150 mg/dL   LDL Cholesterol (Calc) 114 (H) mg/dL (calc)    Comment: Reference range: <100 . Desirable range <100 mg/dL for primary prevention;   <70 mg/dL for patients with CHD or diabetic patients  with > or = 2 CHD risk factors. Marland Kitchen LDL-C is now calculated using the Martin-Hopkins  calculation, which is a validated novel method providing  better accuracy than the Friedewald equation in the  estimation of LDL-C.  Cresenciano Genre et al. Annamaria Helling. 3790;240(97): 2061-2068  (http://education.[facility].com/faq/FAQ164)    Total CHOL/HDL Ratio 4.9 <5.0 (calc)   Non-HDL Cholesterol (Calc) 138 (H) <130 mg/dL (calc)    Comment: For patients with diabetes plus 1 major ASCVD risk  factor, treating to a non-HDL-C goal of <100 mg/dL  (LDL-C of <70 mg/dL) is considered a therapeutic  option.   TSH     Status: Abnormal   Collection Time: 02/21/20 11:46 AM  Result Value Ref Range   TSH 0.20 (L) mIU/L    Comment:           Reference Range .           > or = 20 Years  0.40-4.50 .                Pregnancy Ranges           First trimester    0.26-2.66           Second trimester   0.55-2.73           Third trimester    0.43-2.91   T4, free     Status: None   Collection Time: 02/21/20 11:46 AM  Result Value Ref Range   Free T4 1.2 0.8 - 1.8 ng/dL  TEST AUTHORIZATION     Status: None   Collection Time: 02/21/20 11:46 AM  Result Value Ref Range   TEST NAME: T4, FREE    TEST CODE: [id number]    CLIENT CONTACT: LESLEY SMITH     REPORT ALWAYS MESSAGE SIGNATURE      Comment: . The laboratory testing on this patient was verbally requested or confirmed by the ordering physician or his or her authorized representative after contact with an employee of  [employer]. Federal regulations require that we maintain on file written authorization for all laboratory testing.  Accordingly we are asking that the ordering physician or his or her authorized representative sign a copy of this report and promptly return it to the client service representative. . . Signature:____________________________________________________ . Please fax this signed page to [phone] or return it via your [employer] courier.     Diabetic Foot Exam: Diabetic Foot Exam - Simple   No data filed    ***  PHQ2/9: Depression screen [facility] 2/9 03/01/2020 02/21/2020 12/22/2019 07/15/2019 03/15/2019  Decreased Interest 2 1 0 0 0  Down, Depressed, Hopeless 2 1 0 0 0  PHQ - 2 Score 4 2 0 0 0  Altered sleeping 2 - - 0 0  Tired, decreased energy 3 - - 0 0  Change in appetite 3 - - 0 0  Feeling bad or failure about yourself  1 - - 0 0  Trouble concentrating 3 - - 0 0  Moving slowly or fidgety/restless 3 - - 0 0  Suicidal thoughts 0 - - 0 0  PHQ-9 Score 19 - - 0 0  Difficult doing work/chores Very difficult - - Not difficult at all Not difficult at all    phq 9 is {gen pos BOB:499692} ***  Fall Risk: Fall Risk  03/01/2020 02/21/2020 12/22/2019 07/15/2019 03/15/2019  Falls in the past year? 0 0 0 0 0  Number falls in past yr: 0 0 0 0 0  Injury with Fall? 0 0 0 0 0  Follow up - - - Falls evaluation completed Falls evaluation completed   ***   Functional Status Survey:   ***   Assessment & Plan  *** There are no diagnoses linked to this encounter.

## 2020-05-03 ENCOUNTER — Ambulatory Visit: Payer: Medicaid Other | Admitting: Family Medicine

## 2020-05-09 NOTE — Progress Notes (Signed)
Name: Michele [name]   MRN: [id number]    DOB: [birth year]   Date:05/10/2020       Progress Note  Subjective  Chief Complaint  Medication Refill  HPI  Snoring: she states she noticed memory changes - difficulty remembering things and now more difficulty when studying. She states initially she blamed the fact that she goes to school full time and has children, but symptoms are getting worse , she used to do this as a single mother without problems. She snores at night, wakes up feeling tired. She goes to bed around 10 pm and wakes up around 5:30 am . She has her tonsils and adenoids. We placed a referral for sleepy study back in September but she did not get a phone call, we gave her their number last time but she has not called them yet, she is feeling overwhelmed at this time   Shaking feeling: usually happens after she eats, about one hours later, she develops headaches and shakes, discussed high protein diet and avoid carbs. Her last glucose non fasting was down to 64, explained likely symptoms from hypoglycemia She states since she cut down on carbs she no longer having shakes episodes. Glucose at home has been above 80. 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 She was recently seen at [facility] in [place] and had MRI that showed bulging disc but report not available for review. She had not received injections yet or seen neurosurgeon. Muscle relaxer caused sedation and affects her ability to do her school work  We gave her Lyrica  BID, it makes her feel sleepy, we will increase just the pm dose.  Today pain level is high at 8/10 . Discussed about mindfulness   Dysthymia/Anxiety: she is overwhelmed, taking 18 credit hours at school, also has two children, planning her wedding July 2022, feels anxious and nervous all the time, difficulty focusing, and lately sleeping too much,  feels like she cannot do anything. We discussed therapy and started her on Duloxetine, she states some days are good and some not so good  Low TSH:  Back in Nov we will recheck it today,  discussed sub-clinical hypothyroidism with patient    Patient Active Problem List   Diagnosis Date Noted  . Cervical post-laminectomy syndrome 05/01/2020  . PSVT (paroxysmal supraventricular tachycardia) ([place]) 01/18/2019  . Memory changes 04/24/2018  . Left lumbar radiculitis 04/24/2018    Past Surgical History:  Procedure Laterality Date  . LIPOSUCTION AUOLOGOUS FAT TRANSFER TO BUTTOCKS  03/25/2019   in [place], Dr. Lynann Bologna   . NECK SURGERY      Family History  Problem Relation Age of Onset  . Hypertension Mother   . Allergic rhinitis Mother   . Hypertension Father   . Asthma Sister   . Allergic rhinitis Sister   . Dementia Paternal Grandmother   . Eczema Neg Hx   . Urticaria Neg Hx     Social History   Tobacco Use  . Smoking status: Never Smoker  . Smokeless tobacco: Never Used  Substance Use Topics  . Alcohol use: Never     Current Outpatient Medications:  .  ACCU-CHEK GUIDE test strip, USE UP TO FOUR TIMES DAILY, Disp: , Rfl:  .  Accu-Chek Softclix Lancets lancets, SMARTSIG:Topical 1 to 4 Times Daily, Disp: , Rfl:  .  blood glucose meter kit  and supplies KIT, Dispense based on patient and insurance preference. Use up to four times daily as directed. (FOR E16.2), Disp: 1 each, Rfl: 0 .  DULoxetine (CYMBALTA) 60 MG capsule, Take 1 capsule (60 mg total) by mouth in the morning., Disp: 30 capsule, Rfl: 2 .  pregabalin (LYRICA) 50 MG capsule, Take 1-2 capsules (50-100 mg total) by mouth 2 (two) times daily. One in am and two in pm, Disp: 90 capsule, Rfl: 2  Allergies  Allergen Reactions  . Codeine     I personally reviewed active problem list, medication list, allergies, family history, social history, health maintenance with the patient/caregiver today.   ROS  Constitutional:  Negative for fever or weight change.  Respiratory: Negative for cough and shortness of breath.   Cardiovascular: Negative for chest pain or palpitations.  Gastrointestinal: Negative for abdominal pain, no bowel changes.  Musculoskeletal: Negative for gait problem or joint swelling.  Skin: Negative for rash.  Neurological: Negative for dizziness or headache.  No other specific complaints in a complete review of systems (except as listed in HPI above).  Objective  Vitals:   05/10/20 0819  BP: 112/80  Pulse: 87  Resp: 16  Temp: 98.1 F (36.7 C)  TempSrc: Oral  SpO2: 98%  Weight: 166 lb 11.2 oz (75.6 kg)  Height: 5' 2"  (1.575 m)    Body mass index is 30.49 kg/m.  Physical Exam  Constitutional: Patient appears well-developed and well-nourished. Obese No distress.  HEENT: head atraumatic, normocephalic, pupils equal and reactive to light, neck supple Cardiovascular: Normal rate, regular rhythm and normal heart sounds.  No murmur heard. No BLE edema. Pulmonary/Chest: Effort normal and breath sounds normal. No respiratory distress. Abdominal: Soft.  There is no tenderness. Muscular skeletal : standing up because of pain, pain during palpation of lumbar spine  Psychiatric: Patient has a normal mood and affect. behavior is normal. Judgment and thought content normal.  Recent Results (from the past 2160 hour(s))  Cervicovaginal ancillary only     Status: Abnormal   Collection Time: 02/21/20 11:31 AM  Result Value Ref Range   Candida Vaginitis Negative    Candida Glabrata Negative    Bacterial Vaginitis (gardnerella) Positive (A)    Comment Normal Reference Range Candida Species - Negative    Comment Normal Reference Range Candida Galbrata - Negative    Comment      Normal Reference Range Bacterial Vaginosis - Negative  CBC with Differential/Platelet     Status: Abnormal   Collection Time: 02/21/20 11:46 AM  Result Value Ref Range   WBC 3.8 3.8 - 10.8 Thousand/uL   RBC 4.86  3.80 - 5.10 Million/uL   Hemoglobin 14.1 11.7 - 15.5 g/dL   HCT 43.0 35.0 - 45.0 %   MCV 88.5 80.0 - 100.0 fL   MCH 29.0 27.0 - 33.0 pg   MCHC 32.8 32.0 - 36.0 g/dL   RDW 12.2 11.0 - 15.0 %   Platelets 284 140 - 400 Thousand/uL   MPV 10.6 7.5 - 12.5 fL   Neutro Abs 1,391 (L) 1,500 - 7,800 cells/uL   Lymphs Abs 1,904 850 - 3,900 cells/uL   Absolute Monocytes 372 200 - 950 cells/uL   Eosinophils Absolute 91 15 - 500 cells/uL   Basophils Absolute 42 0 - 200 cells/uL   Neutrophils Relative % 36.6 %   Total Lymphocyte 50.1 %   Monocytes Relative 9.8 %   Eosinophils Relative 2.4 %   Basophils Relative 1.1 %  COMPLETE METABOLIC PANEL  WITH GFR     Status: Abnormal   Collection Time: 02/21/20 11:46 AM  Result Value Ref Range   Glucose, Bld 64 (L) 65 - 139 mg/dL    Comment: .        Non-fasting reference interval .    BUN 11 7 - 25 mg/dL   Creat 0.87 0.50 - 1.10 mg/dL   GFR, Est Non African American 91 > OR = 60 mL/min/1.68m2   GFR, Est African American 105 > OR = 60 mL/min/1.44m2   BUN/Creatinine Ratio NOT APPLICABLE 6 - 22 (calc)   Sodium 139 135 - 146 mmol/L   Potassium 4.2 3.5 - 5.3 mmol/L   Chloride 103 98 - 110 mmol/L   CO2 29 20 - 32 mmol/L   Calcium 10.2 8.6 - 10.2 mg/dL   Total Protein 7.5 6.1 - 8.1 g/dL   Albumin 4.5 3.6 - 5.1 g/dL   Globulin 3.0 1.9 - 3.7 g/dL (calc)   AG Ratio 1.5 1.0 - 2.5 (calc)   Total Bilirubin 0.5 0.2 - 1.2 mg/dL   Alkaline phosphatase (APISO) 53 31 - 125 U/L   AST 18 10 - 30 U/L   ALT 21 6 - 29 U/L  Lipid panel     Status: Abnormal   Collection Time: 02/21/20 11:46 AM  Result Value Ref Range   Cholesterol 173 <200 mg/dL   HDL 35 (L) > OR = 50 mg/dL   Triglycerides 129 <150 mg/dL   LDL Cholesterol (Calc) 114 (H) mg/dL (calc)    Comment: Reference range: <100 . Desirable range <100 mg/dL for primary prevention;   <70 mg/dL for patients with CHD or diabetic patients  with > or = 2 CHD risk factors. Marland Kitchen LDL-C is now calculated using the  Martin-Hopkins  calculation, which is a validated novel method providing  better accuracy than the Friedewald equation in the  estimation of LDL-C.  Cresenciano Genre et al. Annamaria Helling. 3382;505(39): 2061-2068  (http://education.[facility].com/faq/FAQ164)    Total CHOL/HDL Ratio 4.9 <5.0 (calc)   Non-HDL Cholesterol (Calc) 138 (H) <130 mg/dL (calc)    Comment: For patients with diabetes plus 1 major ASCVD risk  factor, treating to a non-HDL-C goal of <100 mg/dL  (LDL-C of <70 mg/dL) is considered a therapeutic  option.   TSH     Status: Abnormal   Collection Time: 02/21/20 11:46 AM  Result Value Ref Range   TSH 0.20 (L) mIU/L    Comment:           Reference Range .           > or = 20 Years  0.40-4.50 .                Pregnancy Ranges           First trimester    0.26-2.66           Second trimester   0.55-2.73           Third trimester    0.43-2.91   T4, free     Status: None   Collection Time: 02/21/20 11:46 AM  Result Value Ref Range   Free T4 1.2 0.8 - 1.8 ng/dL  TEST AUTHORIZATION     Status: None   Collection Time: 02/21/20 11:46 AM  Result Value Ref Range   TEST NAME: T4, FREE    TEST CODE: [id number]    CLIENT CONTACT: LESLEY SMITH     REPORT ALWAYS MESSAGE SIGNATURE      Comment: .  The laboratory testing on this patient was verbally requested or confirmed by the ordering physician or his or her authorized representative after contact with an employee of [employer]. Federal regulations require that we maintain on file written authorization for all laboratory testing.  Accordingly we are asking that the ordering physician or his or her authorized representative sign a copy of this report and promptly return it to the client service representative. . . Signature:____________________________________________________ . Please fax this signed page to [phone] or return it via your [employer] courier.      PHQ2/9: Depression screen [facility] 2/9 05/10/2020  05/10/2020 03/01/2020 02/21/2020 12/22/2019  Decreased Interest 1 0 2 1 0  Down, Depressed, Hopeless 1 0 2 1 0  PHQ - 2 Score 2 0 4 2 0  Altered sleeping 0 - 2 - -  Tired, decreased energy 1 - 3 - -  Change in appetite 1 - 3 - -  Feeling bad or failure about yourself  0 - 1 - -  Trouble concentrating 2 - 3 - -  Moving slowly or fidgety/restless 0 - 3 - -  Suicidal thoughts 0 - 0 - -  PHQ-9 Score 6 - 19 - -  Difficult doing work/chores Somewhat difficult - Very difficult - -  Some recent data might be hidden    phq 9 is positive   Fall Risk: Fall Risk  05/10/2020 03/01/2020 02/21/2020 12/22/2019 07/15/2019  Falls in the past year? 0 0 0 0 0  Number falls in past yr: 0 0 0 0 0  Injury with Fall? 0 0 0 0 0  Follow up - - - - Falls evaluation completed     Functional Status Survey: Is the patient deaf or have difficulty hearing?: No Does the patient have difficulty seeing, even when wearing glasses/contacts?: No Does the patient have difficulty concentrating, remembering, or making decisions?: No Does the patient have difficulty walking or climbing stairs?: No Does the patient have difficulty dressing or bathing?: No Does the patient have difficulty doing errands alone such as visiting a doctor's office or shopping?: No    Assessment & Plan  1. Chronic bilateral low back pain with left-sided sciatica  - pregabalin (LYRICA) 50 MG capsule; Take 1-2 capsules (50-100 mg total) by mouth 2 (two) times daily. One in am and two in pm  Dispense: 90 capsule; Refill: 2 - DULoxetine (CYMBALTA) 60 MG capsule; Take 1 capsule (60 mg total) by mouth in the morning.  Dispense: 30 capsule; Refill: 2  2. Dysthymia  - DULoxetine (CYMBALTA) 60 MG capsule; Take 1 capsule (60 mg total) by mouth in the morning.  Dispense: 30 capsule; Refill: 2  3. GAD (generalized anxiety disorder)  - DULoxetine (CYMBALTA) 60 MG capsule; Take 1 capsule (60 mg total) by mouth in the morning.  Dispense: 30 capsule;  Refill: 2  4. Abnormal TSH  - TSH + free T4

## 2020-05-10 ENCOUNTER — Other Ambulatory Visit: Payer: Self-pay

## 2020-05-10 ENCOUNTER — Ambulatory Visit: Payer: Medicaid Other | Admitting: Family Medicine

## 2020-05-10 ENCOUNTER — Encounter: Payer: Self-pay | Admitting: Family Medicine

## 2020-05-10 VITALS — BP 112/80 | HR 87 | Temp 98.1°F | Resp 16 | Ht 62.0 in | Wt 166.7 lb

## 2020-05-10 DIAGNOSIS — M5442 Lumbago with sciatica, left side: Secondary | ICD-10-CM | POA: Diagnosis not present

## 2020-05-10 DIAGNOSIS — F341 Dysthymic disorder: Secondary | ICD-10-CM

## 2020-05-10 DIAGNOSIS — G8929 Other chronic pain: Secondary | ICD-10-CM

## 2020-05-10 DIAGNOSIS — R7989 Other specified abnormal findings of blood chemistry: Secondary | ICD-10-CM

## 2020-05-10 DIAGNOSIS — F411 Generalized anxiety disorder: Secondary | ICD-10-CM | POA: Diagnosis not present

## 2020-05-10 MED ORDER — DULOXETINE HCL 60 MG PO CPEP: 60.0000 mg | ORAL_CAPSULE | Freq: Every morning | ORAL | 2 refills | Status: DC

## 2020-05-10 MED ORDER — PREGABALIN 50 MG PO CAPS: 50.0000 mg | ORAL_CAPSULE | Freq: Two times a day (BID) | ORAL | 2 refills | Status: DC

## 2020-05-11 ENCOUNTER — Encounter: Payer: Self-pay | Admitting: Family Medicine

## 2020-05-11 LAB — TSH+FREE T4: TSH W/REFLEX TO FT4: 0.31 mIU/L — ABNORMAL LOW

## 2020-05-11 LAB — T4, FREE: Free T4: 0.9 ng/dL (ref 0.8–1.8)

## 2020-05-12 ENCOUNTER — Other Ambulatory Visit: Payer: Self-pay | Admitting: Family Medicine

## 2020-05-12 DIAGNOSIS — R7989 Other specified abnormal findings of blood chemistry: Secondary | ICD-10-CM

## 2020-05-19 ENCOUNTER — Encounter: Payer: Self-pay | Admitting: Family Medicine

## 2020-05-23 NOTE — Progress Notes (Unsigned)
Name: Michele [name]   MRN: [id number]    DOB: 07-20-1990   Date:05/23/2020       Progress Note  Subjective  Chief Complaint  Lightheaded, dizziness, constant headache  I connected with  Michele [name]  on 05/23/20 at 11:40 AM EST by a video enabled telemedicine application and verified that I am speaking with the correct person using two identifiers.  I discussed the limitations of evaluation and management by telemedicine and the availability of in person appointments. The patient expressed understanding and agreed to proceed with the virtual visit  Staff also discussed with the patient that there may be a patient responsible charge related to this service. Patient Location: at home  Provider Location: [facility] Additional Individuals present: not done   HPI  Fatigue:symptoms started about 8 days  she states she has been very tired all the time, when she gets up she feels dizzy, also has head pressure, that can be very intense at times, mild nasal congestion, no rhinorrhea. She also has some neck soreness. . No fever or chills. No sore throat . She has been taking Tylenol without help . Nobody at home has been ill, she works as a Educational psychologist on the weekends, but she called out this past Friday, but has been back to work since. She is worries about her thyroid   Patient Active Problem List   Diagnosis Date Noted  . Cervical post-laminectomy syndrome 05/01/2020  . PSVT (paroxysmal supraventricular tachycardia) ([place]) 01/18/2019  . Memory changes 04/24/2018  . Left lumbar radiculitis 04/24/2018    Past Surgical History:  Procedure Laterality Date  . LIPOSUCTION AUOLOGOUS FAT TRANSFER TO BUTTOCKS  03/25/2019   in [place], Dr. Lynann Bologna   . NECK SURGERY      Family History  Problem Relation Age of Onset  . Hypertension Mother   . Allergic rhinitis Mother   . Hypertension Father   . Asthma Sister   . Allergic rhinitis Sister   . Dementia Paternal Grandmother   . Eczema Neg Hx   . Urticaria  Neg Hx     Social History   Socioeconomic History  . Marital status: Single    Spouse name: Not on file  . Number of children: 2  . Years of education: Not on file  . Highest education level: Not on file  Occupational History  . Occupation: Sports administrator    Comment: [employer]  Tobacco Use  . Smoking status: Never Smoker  . Smokeless tobacco: Never Used  Vaping Use  . Vaping Use: Never used  Substance and Sexual Activity  . Alcohol use: Never  . Drug use: Never  . Sexual activity: Yes    Partners: Male    Birth control/protection: I.U.D.  Other Topics Concern  . 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 Difficulty of Paying Living Expenses: Patient refused  Food Insecurity: Unknown  . Worried About [place] fundraiser in the Last Year: Patient refused  . Ran Out of Food in the Last Year: Patient refused  Transportation Needs: Unknown  . Lack of Transportation (Medical): Patient refused  . Lack of Transportation (Non-Medical): Patient refused  Physical Activity: Inactive  . Days of Exercise per Week: 0 days  . Minutes of Exercise per Session: 0 min  Stress: Stress Concern Present  . Feeling of Stress : Very much  Social Connections: Moderately Isolated  . Frequency of Communication with Friends and Family: More than three times a week  . Frequency of Social Gatherings with Friends and Family: Once a week  . Attends Religious Services: Never  . Active Member of Clubs or Organizations: Yes  . Attends Archivist Meetings: More than 4 times per year  . Marital Status: Never married  Intimate Partner Violence: Not At Risk  . Fear of Current or Ex-Partner: No  .  Emotionally Abused: No  . Physically Abused: No  . Sexually Abused: No     Current Outpatient Medications:  .  ACCU-CHEK GUIDE test strip, USE UP TO FOUR TIMES DAILY, Disp: , Rfl:  .  Accu-Chek Softclix Lancets lancets, SMARTSIG:Topical 1 to 4 Times Daily, Disp: , Rfl:  .  blood glucose meter kit and supplies KIT, Dispense based on patient and insurance preference. Use up to four times daily as directed. (FOR E16.2), Disp: 1 each, Rfl: 0 .  DULoxetine (CYMBALTA) 60 MG capsule, Take 1 capsule (60 mg total) by mouth in the morning., Disp: 30 capsule, Rfl: 2 .  pregabalin (LYRICA) 50 MG capsule, Take 1-2 capsules (50-100 mg total) by mouth 2 (two) times daily. One in am and two in pm, Disp: 90 capsule, Rfl: 2  Allergies  Allergen Reactions  . Codeine     I personally reviewed active problem list, medication list, allergies, family history, social history, health maintenance, notes from last encounter with the patient/caregiver today.   ROS  Ten systems reviewed and is negative except as mentioned in HPI   Objective  Virtual encounter, vitals not obtained.  There is no height or weight on file to calculate BMI.  Physical Exam  Awake, alert and oriented   PHQ2/9: Depression screen [facility] 2/9 05/10/2020 05/10/2020 03/01/2020 02/21/2020 12/22/2019  Decreased Interest 1 0 2 1 0  Down, Depressed, Hopeless 1 0 2 1 0  PHQ - 2 Score 2 0 4 2 0  Altered sleeping 0 - 2 - -  Tired, decreased energy 1 - 3 - -  Change in appetite 1 - 3 - -  Feeling bad or failure about yourself  0 - 1 - -  Trouble concentrating 2 - 3 - -  Moving slowly or fidgety/restless 0 - 3 - -  Suicidal thoughts 0 - 0 - -  PHQ-9 Score 6 - 19 - -  Difficult doing work/chores Somewhat difficult - Very difficult - -  Some recent data might be hidden   PHQ-2/9 Result is positive.    Fall Risk: Fall Risk  05/10/2020 03/01/2020 02/21/2020 12/22/2019 07/15/2019  Falls in the past year? 0 0 0 0 0  Number falls in past yr: 0 0 0  0 0  Injury with Fall? 0 0 0 0 0  Follow up - - - - Falls evaluation completed     Assessment & Plan  1. Other fatigue  - CBC with Differential/Platelet - Vitamin B12 - Comprehensive metabolic panel - Novel Coronavirus, NAA ([facility])  Discussed fluids, rest, get up slowly, okay to stay home for remote learning needs to be checked for COVID-19   2. Low TSH level  Reviewed results with patient , waiting for appointment with endo   3. Acute nonintractable headache, unspecified headache  type  - Novel Coronavirus, NAA ([facility])  I discussed the assessment and treatment plan with the patient. The patient was provided an opportunity to ask questions and all were answered. The patient agreed with the plan and demonstrated an understanding of the instructions.  The patient was advised to call back or seek an in-person evaluation if the symptoms worsen or if the condition fails to improve as anticipated.  I provided 15  minutes of non-face-to-face time during this encounter.

## 2020-05-24 ENCOUNTER — Telehealth (INDEPENDENT_AMBULATORY_CARE_PROVIDER_SITE_OTHER): Payer: Medicaid Other | Admitting: Family Medicine

## 2020-05-24 ENCOUNTER — Encounter: Payer: Self-pay | Admitting: Family Medicine

## 2020-05-24 VITALS — Ht 62.0 in | Wt 166.0 lb

## 2020-05-24 DIAGNOSIS — R5383 Other fatigue: Secondary | ICD-10-CM | POA: Diagnosis not present

## 2020-05-24 DIAGNOSIS — R519 Headache, unspecified: Secondary | ICD-10-CM

## 2020-05-24 DIAGNOSIS — R7989 Other specified abnormal findings of blood chemistry: Secondary | ICD-10-CM

## 2020-07-05 ENCOUNTER — Encounter: Payer: Self-pay | Admitting: Internal Medicine

## 2020-07-05 ENCOUNTER — Ambulatory Visit (INDEPENDENT_AMBULATORY_CARE_PROVIDER_SITE_OTHER): Payer: Medicaid Other | Admitting: Internal Medicine

## 2020-07-05 ENCOUNTER — Other Ambulatory Visit: Payer: Self-pay

## 2020-07-05 VITALS — BP 114/82 | HR 80 | Ht 62.0 in | Wt 163.0 lb

## 2020-07-05 DIAGNOSIS — E0590 Thyrotoxicosis, unspecified without thyrotoxic crisis or storm: Secondary | ICD-10-CM

## 2020-07-05 LAB — CBC WITH DIFFERENTIAL/PLATELET
Basophils Absolute: 0 10*3/uL (ref 0.0–0.1)
Basophils Relative: 0.5 % (ref 0.0–3.0)
Eosinophils Absolute: 0.1 10*3/uL (ref 0.0–0.7)
Eosinophils Relative: 3.6 % (ref 0.0–5.0)
HCT: 41.8 % (ref 36.0–46.0)
Hemoglobin: 14.1 g/dL (ref 12.0–15.0)
Lymphocytes Relative: 51 % — ABNORMAL HIGH (ref 12.0–46.0)
Lymphs Abs: 1.6 10*3/uL (ref 0.7–4.0)
MCHC: 33.7 g/dL (ref 30.0–36.0)
MCV: 86.9 fl (ref 78.0–100.0)
Monocytes Absolute: 0.3 10*3/uL (ref 0.1–1.0)
Monocytes Relative: 8 % (ref 3.0–12.0)
Neutro Abs: 1.2 10*3/uL — ABNORMAL LOW (ref 1.4–7.7)
Neutrophils Relative %: 36.9 % — ABNORMAL LOW (ref 43.0–77.0)
Platelets: 255 10*3/uL (ref 150.0–400.0)
RBC: 4.81 Mil/uL (ref 3.87–5.11)
RDW: 13.3 % (ref 11.5–15.5)
WBC: 3.1 10*3/uL — ABNORMAL LOW (ref 4.0–10.5)

## 2020-07-05 LAB — TSH: TSH: 0.25 u[IU]/mL — ABNORMAL LOW (ref 0.35–4.50)

## 2020-07-05 LAB — COMPREHENSIVE METABOLIC PANEL
ALT: 20 U/L (ref 0–35)
AST: 18 U/L (ref 0–37)
Albumin: 4.6 g/dL (ref 3.5–5.2)
Alkaline Phosphatase: 52 U/L (ref 39–117)
BUN: 9 mg/dL (ref 6–23)
CO2: 27 mEq/L (ref 19–32)
Calcium: 10 mg/dL (ref 8.4–10.5)
Chloride: 104 mEq/L (ref 96–112)
Creatinine, Ser: 0.71 mg/dL (ref 0.40–1.20)
GFR: 115.17 mL/min (ref >60.00)
Glucose, Bld: 91 mg/dL (ref 70–99)
Potassium: 4.4 mEq/L (ref 3.5–5.1)
Sodium: 138 mEq/L (ref 135–145)
Total Bilirubin: 0.6 mg/dL (ref 0.2–1.2)
Total Protein: 8.1 g/dL (ref 6.0–8.3)

## 2020-07-05 LAB — T4, FREE: Free T4: 0.76 ng/dL (ref 0.60–1.60)

## 2020-07-05 MED ORDER — METHIMAZOLE 5 MG PO TABS: 5.0000 mg | ORAL_TABLET | Freq: Every day | ORAL | 6 refills | Status: DC

## 2020-07-05 NOTE — Progress Notes (Signed)
Name: Michele [name]  MRN/ DOB: [id number], 12-06-1990    Age/ Sex: 30 y.o., female    PCP: Steele Sizer, MD   Reason for Endocrinology Evaluation: Subclinical hyperthyroidism     Date of Initial Endocrinology Evaluation: 07/05/2020     HPI: [name] is a 30 y.o. female with a past medical history of PSVT. The patient presented for initial endocrinology clinic visit on 07/05/2020 for consultative assistance with her subclinical hypertyhyroidism   She has been noted with intermittent low TSH since 2020 with a nadir of 0.20 uIU/mL  In 05/2020 but with normal FT4.   She denies weight loss  No local neck swelling  Has muscle neck/shoulder  Denies diarrhea/loose stools Has shaking of the hands that she attributes to low sugar    Was diagnosed with SVT's since high school, last time she had issues with it was during pregnancy in 2014. Was on Troprol at some point.   Has two kids born in 2013 and 2014  She has an IUD LMP 07/04/2020, regular  Unknown thyroid history in the family  Has anxiety and irritability    HISTORY:   Past Medical History: History reviewed. No pertinent past medical history. Past Surgical History:  Past Surgical History:  Procedure Laterality Date  . LIPOSUCTION AUOLOGOUS FAT TRANSFER TO BUTTOCKS  03/25/2019   in [place], Dr. Lynann Bologna   . NECK SURGERY        Social History:  reports that she has never smoked. She has never used smokeless tobacco. She reports that she does not drink alcohol and does not use drugs.  Family History: family history includes Allergic rhinitis in her mother and sister; Asthma in her sister; Dementia in her paternal grandmother; Hypertension in her father and mother.   HOME MEDICATIONS: Allergies as of 07/05/2020      Reactions   Codeine       Medication List       Accurate as of July 05, 2020  8:12 AM. If you have any questions, ask your nurse or doctor.        Accu-Chek Guide test strip Generic drug:  glucose blood USE UP TO FOUR TIMES DAILY   Accu-Chek Softclix Lancets lancets SMARTSIG:Topical 1 to 4 Times Daily   blood glucose meter kit and supplies Kit Dispense based on patient and insurance preference. Use up to four times daily as directed. (FOR E16.2)   DULoxetine 60 MG capsule Commonly known as: CYMBALTA Take 1 capsule (60 mg total) by mouth in the morning.   pregabalin 50 MG capsule Commonly known as: Lyrica Take 1-2 capsules (50-100 mg total) by mouth 2 (two) times daily. One in am and two in pm         REVIEW OF SYSTEMS: A comprehensive ROS was conducted with the patient and is negative except as per HPI     OBJECTIVE:  VS: BP 114/82   Pulse 80   Ht 5' 2"  (1.575 m)   Wt 163 lb (73.9 kg)   SpO2 98%   BMI 29.81 kg/m    Wt Readings from Last 3 Encounters:  07/05/20 163 lb (73.9 kg)  05/24/20 166 lb (75.3 kg)  05/10/20 166 lb 11.2 oz (75.6 kg)     EXAM: General: Pt appears well and is in NAD  Neck: General: Supple without adenopathy. Thyroid: Thyroid size normal.  No goiter or nodules appreciated. No thyroid bruit.  Lungs: Clear with good BS bilat with no rales, rhonchi, or wheezes  Heart: Auscultation: RRR.  Abdomen: Normoactive bowel sounds, soft, nontender, without masses or organomegaly palpable  Extremities:  BL LE: No pretibial edema normal ROM and strength.  Skin: Hair: Texture and amount normal with gender appropriate distribution Skin Inspection: No rashes Skin Palpation: Skin temperature, texture, and thickness normal to palpation  Neuro: Cranial nerves: II - XII grossly intact  Motor: Normal strength throughout DTRs: 2+ and symmetric in UE without delay in relaxation phase  Mental Status: Judgment, insight: Intact Orientation: Oriented to time, place, and person Mood and affect: No depression, anxiety, or agitation     DATA REVIEWED: Results for Michele, [name] (MRN [id number]) as of 07/05/2020 15:04  Ref. Range 07/05/2020 08:18   Sodium Latest Ref Range: 135 - 145 mEq/L 138  Potassium Latest Ref Range: 3.5 - 5.1 mEq/L 4.4  Chloride Latest Ref Range: 96 - 112 mEq/L 104  CO2 Latest Ref Range: 19 - 32 mEq/L 27  Glucose Latest Ref Range: 70 - 99 mg/dL 91  BUN Latest Ref Range: 6 - 23 mg/dL 9  Creatinine Latest Ref Range: 0.40 - 1.20 mg/dL 0.71  Calcium Latest Ref Range: 8.4 - 10.5 mg/dL 10.0  Alkaline Phosphatase Latest Ref Range: 39 - 117 U/L 52  Albumin Latest Ref Range: 3.5 - 5.2 g/dL 4.6  AST Latest Ref Range: 0 - 37 U/L 18  ALT Latest Ref Range: 0 - 35 U/L 20  Total Protein Latest Ref Range: 6.0 - 8.3 g/dL 8.1  Total Bilirubin Latest Ref Range: 0.2 - 1.2 mg/dL 0.6  GFR Latest Ref Range: >60.00 mL/min 115.17  WBC Latest Ref Range: 4.0 - 10.5 K/uL 3.1 (L)  RBC Latest Ref Range: 3.87 - 5.11 Mil/uL 4.81  Hemoglobin Latest Ref Range: 12.0 - 15.0 g/dL 14.1  HCT Latest Ref Range: 36.0 - 46.0 % 41.8  MCV Latest Ref Range: 78.0 - 100.0 fl 86.9  MCHC Latest Ref Range: 30.0 - 36.0 g/dL 33.7  RDW Latest Ref Range: 11.5 - 15.5 % 13.3  Platelets Latest Ref Range: 150.0 - 400.0 K/uL 255.0  Neutrophils Latest Ref Range: 43.0 - 77.0 % 36.9 (L)  Lymphocytes Latest Ref Range: 12.0 - 46.0 % 51.0 (H)  Monocytes Relative Latest Ref Range: 3.0 - 12.0 % 8.0  Eosinophil Latest Ref Range: 0.0 - 5.0 % 3.6  Basophil Latest Ref Range: 0.0 - 3.0 % 0.5  NEUT# Latest Ref Range: 1.4 - 7.7 K/uL 1.2 (L)  Lymphocyte # Latest Ref Range: 0.7 - 4.0 K/uL 1.6  Monocyte # Latest Ref Range: 0.1 - 1.0 K/uL 0.3  Eosinophils Absolute Latest Ref Range: 0.0 - 0.7 K/uL 0.1  Basophils Absolute Latest Ref Range: 0.0 - 0.1 K/uL 0.0  TSH Latest Ref Range: 0.35 - 4.50 uIU/mL 0.25 (L)  T4,Free(Direct) Latest Ref Range: 0.60 - 1.60 ng/dL 0.76    ASSESSMENT/PLAN/RECOMMENDATIONS:   1. Subclinical Hyperthyroidism:  - Pt is clinically euthyroid  - No local neck symptoms  - D/D graves' disease vs autonomous thyroid nodule(s) vs subacute thyroiditis  -  Will check TRAb  - Given her history of SVT , I would recommend treatment.  - We discussed that Graves' Disease is a result of an autoimmune condition involving the thyroid.    We discussed with pt the benefits of methimazole in the Tx of hyperthyroidism, as well as the possible side effects/complications of anti-thyroid drug Tx (specifically detailing the rare, but serious side effect of agranulocytosis). She was informed of need for regular thyroid function monitoring while on methimazole to  ensure appropriate dosage without over-treatment. As well, we discussed the possible side effects of methimazole including the chance of rash, the small chance of liver irritation/juandice and the <=1 in 300-400 chance of sudden onset agranulocytosis.  We discussed importance of going to ED promptly (and stopping methimazole) if shewere to develop significant fever with severe sore throat of other evidence of acute infection.      We extensively discussed the various treatment options for hyperthyroidism and Graves disease including ablation therapy with radioactive iodine versus antithyroid drug treatment versus surgical therapy.  We recommended to the patient that we felt, at this time, that thionamide therapy would be most optimal.  We discussed the various possible benefits versus side effects of the various therapies.   I carefully explained to the patient that one of the consequences of I-131 ablation treatment would likely be permanent hypothyroidism which would require long-term replacement therapy with LT4.   Medications : Methimazole 5 mg , 1 tablet daily    F/U in 4 months  Labs in 6 weeks    Signed electronically by: Mack Guise, MD  [facility]  [facility] [address], [place] [address] Phone: [phone] FAX: [phone]   CC: Steele Sizer, [address] [place] [address] Phone: [phone] Fax:  [phone]   Return to Endocrinology clinic as below: Future Appointments  Date Time Provider Junior  08/09/2020  9:20 AM Steele Sizer, MD [facility]  02/22/2021  8:00 AM Steele Sizer, MD [facility]

## 2020-07-09 LAB — TRAB (TSH RECEPTOR BINDING ANTIBODY): TRAB: <1 IU/L (ref <=2.00)

## 2020-07-09 LAB — T3: T3, Total: 99 ng/dL (ref 76–181)

## 2020-08-08 NOTE — Progress Notes (Deleted)
Name: Michele [name]   MRN: [id number]    DOB: 01/13/1991   Date:08/08/2020       Progress Note  Subjective  Chief Complaint  Follow Up  HPI  Snoring: she states she noticed memory changes - difficulty remembering things and now more difficulty when studying. She states initially she blamed the fact that she goes to school full time and has children, but symptoms are getting worse , she used to do this as a single mother without problems. She snores at night, wakes up feeling tired. She goes to bed around 10 pm and wakes up around 5:30 am . She has her tonsils and adenoids. We placed a referral for sleepy study back in September but she did not get a phone call, we gave her their number last time but she has not called them yet, she is feeling overwhelmed at this time   Shaking feeling: usually happens after she eats, about one hours later, she develops headaches and shakes, discussed high protein diet and avoid carbs. Her last glucose non fasting was down to 64, explained likely symptoms from hypoglycemia She states since she cut down on carbs she no longer having shakes episodes. Glucose at home has been above 80. 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 She was recently seen at [facility] in [place] and had MRI that showed bulging disc but report not available for review. She had not received injections yet or seen neurosurgeon. Muscle relaxer caused sedation and affects her ability to do her school work  We gave her Lyrica  BID, it makes her feel sleepy, we will increase just the pm dose.  Today pain level is high at 8/10 . Discussed about mindfulness   Dysthymia/Anxiety: she is overwhelmed, taking 18 credit hours at school, also has two children, planning her wedding July 2022, feels anxious and nervous all the time, difficulty focusing, and lately sleeping too much, feels like  she cannot do anything. We discussed therapy and started her on Duloxetine, she states some days are good and some not so good  Low TSH:  Back in Nov we will recheck it today,  discussed sub-clinical hypothyroidism with patient   Patient Active Problem List   Diagnosis Date Noted  . Cervical post-laminectomy syndrome 05/01/2020  . PSVT (paroxysmal supraventricular tachycardia) ([place]) 01/18/2019  . Memory changes 04/24/2018  . Left lumbar radiculitis 04/24/2018    Past Surgical History:  Procedure Laterality Date  . LIPOSUCTION AUOLOGOUS FAT TRANSFER TO BUTTOCKS  03/25/2019   in [place], Dr. Lynann Bologna   . NECK SURGERY      Family History  Problem Relation Age of Onset  . Hypertension Mother   . Allergic rhinitis Mother   . Hypertension Father   . Asthma Sister   . Allergic rhinitis Sister   . Dementia Paternal Grandmother   . Eczema Neg Hx   . Urticaria Neg Hx     Social History   Tobacco Use  . Smoking status: Never Smoker  . Smokeless tobacco: Never Used  Substance Use Topics  . Alcohol use: Never     Current Outpatient Medications:  .  ACCU-CHEK GUIDE test strip, USE UP TO FOUR TIMES DAILY, Disp: , Rfl:  .  Accu-Chek Softclix Lancets lancets, SMARTSIG:Topical 1 to 4 Times Daily (Patient not taking: Reported on 07/05/2020), Disp: , Rfl:  .  blood glucose meter kit and supplies KIT, Dispense based on patient and insurance preference. Use up to four times daily as directed. (FOR E16.2) (Patient not taking: Reported on 07/05/2020), Disp: 1 each, Rfl: 0 .  DULoxetine (CYMBALTA) 60 MG capsule, Take 1 capsule (60 mg total) by mouth in the morning. (Patient not taking: Reported on 07/05/2020), Disp: 30 capsule, Rfl: 2 .  methimazole (TAPAZOLE) 5 MG tablet, Take 1 tablet (5 mg total) by mouth daily., Disp: 30 tablet, Rfl: 6 .  pregabalin (LYRICA) 50 MG capsule, Take 1-2 capsules (50-100 mg total) by mouth 2 (two) times daily. One in am and two in pm (Patient not taking: Reported on  07/05/2020), Disp: 90 capsule, Rfl: 2  Allergies  Allergen Reactions  . Codeine     I personally reviewed {Reviewed:14835} with the patient/caregiver today.   ROS  ***  Objective  There were no vitals filed for this visit.  There is no height or weight on file to calculate BMI.  Physical Exam ***  Recent Results (from the past 2160 hour(s))  TSH + free T4     Status: Abnormal   Collection Time: 05/10/20  9:10 AM  Result Value Ref Range   TSH W/REFLEX TO FT4 0.31 (L) mIU/L    Comment:           Reference Range .           > or = 20 Years  0.40-4.50 .                Pregnancy Ranges           First trimester    0.26-2.66           Second trimester   0.55-2.73           Third trimester    0.43-2.91   T4, free     Status: None   Collection Time: 05/10/20  9:10 AM  Result Value Ref Range   Free T4 0.9 0.8 - 1.8 ng/dL  TRAb (TSH Receptor Binding Antibody)     Status: None   Collection Time: 07/05/20  8:18 AM  Result Value Ref Range   TRAB <1.00 <=2.00 IU/L    Comment: . This test was performed using the TRAb Antibody ELISA method which is standardized against the 1st International Standard 90/672 and is reported in International Units (IU/L). The reference range reported was established specifically for this test method. Marland Kitchen   CBC with Differential/Platelet     Status: Abnormal   Collection Time: 07/05/20  8:18 AM  Result Value Ref Range   WBC 3.1 (L) 4.0 - 10.5 K/uL   RBC 4.81 3.87 - 5.11 Mil/uL   Hemoglobin 14.1 12.0 - 15.0 g/dL   HCT 41.8 36.0 - 46.0 %   MCV 86.9 78.0 - 100.0 fl   MCHC 33.7 30.0 - 36.0 g/dL   RDW 13.3 11.5 - 15.5 %   Platelets 255.0 150.0 - 400.0 K/uL   Neutrophils Relative % 36.9 (L) 43.0 - 77.0 %   Lymphocytes Relative 51.0 (H) 12.0 - 46.0 %   Monocytes Relative 8.0 3.0 - 12.0 %   Eosinophils Relative 3.6 0.0 - 5.0 %   Basophils Relative 0.5 0.0 - 3.0 %   Neutro Abs 1.2 (L) 1.4 - 7.7 K/uL   Lymphs Abs 1.6 0.7 - 4.0 K/uL   Monocytes  Absolute 0.3 0.1 - 1.0 K/uL   Eosinophils Absolute 0.1 0.0 - 0.7 K/uL   Basophils Absolute  0.0 0.0 - 0.1 K/uL  Comprehensive metabolic panel     Status: None   Collection Time: 07/05/20  8:18 AM  Result Value Ref Range   Sodium 138 135 - 145 mEq/L   Potassium 4.4 3.5 - 5.1 mEq/L   Chloride 104 96 - 112 mEq/L   CO2 27 19 - 32 mEq/L   Glucose, Bld 91 70 - 99 mg/dL   BUN 9 6 - 23 mg/dL   Creatinine, Ser 0.71 0.40 - 1.20 mg/dL   Total Bilirubin 0.6 0.2 - 1.2 mg/dL   Alkaline Phosphatase 52 39 - 117 U/L   AST 18 0 - 37 U/L   ALT 20 0 - 35 U/L   Total Protein 8.1 6.0 - 8.3 g/dL   Albumin 4.6 3.5 - 5.2 g/dL   GFR 115.17 >60.00 mL/min    Comment: Calculated using the CKD-EPI Creatinine Equation (2021)   Calcium 10.0 8.4 - 10.5 mg/dL  TSH     Status: Abnormal   Collection Time: 07/05/20  8:18 AM  Result Value Ref Range   TSH 0.25 (L) 0.35 - 4.50 uIU/mL  T3     Status: None   Collection Time: 07/05/20  8:18 AM  Result Value Ref Range   T3, Total 99 76 - 181 ng/dL  T4, free     Status: None   Collection Time: 07/05/20  8:18 AM  Result Value Ref Range   Free T4 0.76 0.60 - 1.60 ng/dL    Comment: Specimens from patients who are undergoing biotin therapy and /or ingesting biotin supplements may contain high levels of biotin.  The higher biotin concentration in these specimens interferes with this Free T4 assay.  Specimens that contain high levels  of biotin may cause false high results for this Free T4 assay.  Please interpret results in light of the total clinical presentation of the patient.      Diabetic Foot Exam: Diabetic Foot Exam - Simple   No data filed    ***  PHQ2/9: Depression screen [facility] 2/9 05/24/2020 05/10/2020 05/10/2020 03/01/2020 02/21/2020  Decreased Interest 1 1 0 2 1  Down, Depressed, Hopeless 1 1 0 2 1  PHQ - 2 Score 2 2 0 4 2  Altered sleeping 0 0 - 2 -  Tired, decreased energy 1 1 - 3 -  Change in appetite 1 1 - 3 -  Feeling bad or failure about yourself  0 0  - 1 -  Trouble concentrating 2 2 - 3 -  Moving slowly or fidgety/restless 0 0 - 3 -  Suicidal thoughts 0 0 - 0 -  PHQ-9 Score 6 6 - 19 -  Difficult doing work/chores Somewhat difficult Somewhat difficult - Very difficult -  Some recent data might be hidden    phq 9 is {gen pos [id number]} ***  Fall Risk: Fall Risk  05/24/2020 05/10/2020 03/01/2020 02/21/2020 12/22/2019  Falls in the past year? 0 0 0 0 0  Number falls in past yr: 0 0 0 0 0  Injury with Fall? 0 0 0 0 0  Follow up - - - - -   ***   Functional Status Survey:   ***   Assessment & Plan  *** There are no diagnoses linked to this encounter.

## 2020-08-09 ENCOUNTER — Ambulatory Visit: Payer: Medicaid Other | Admitting: Family Medicine

## 2020-11-15 ENCOUNTER — Ambulatory Visit: Payer: Medicaid Other | Admitting: Internal Medicine

## 2021-02-22 ENCOUNTER — Encounter: Payer: Medicaid Other | Admitting: Family Medicine

## 2021-03-29 ENCOUNTER — Encounter: Payer: Medicaid Other | Admitting: Family Medicine

## 2021-04-12 ENCOUNTER — Encounter: Payer: Self-pay | Admitting: Family Medicine

## 2021-05-21 ENCOUNTER — Encounter: Payer: Self-pay | Admitting: Family Medicine

## 2021-05-25 ENCOUNTER — Ambulatory Visit: Payer: Medicaid Other | Admitting: Internal Medicine

## 2021-05-25 NOTE — Progress Notes (Unsigned)
Established Patient Office Visit  Subjective:  Patient ID: Michele [name], female    DOB: 13-Jul-1990  Age: 31 y.o. MRN: [id number]  CC: No chief complaint on file.   HPI Michele [name] presents for referral.     No past medical history on file.  Past Surgical History:  Procedure Laterality Date   LIPOSUCTION AUOLOGOUS FAT TRANSFER TO BUTTOCKS  03/25/2019   in [place], Dr. Lynann Bologna    NECK SURGERY      Family History  Problem Relation Age of Onset   Hypertension Mother    Allergic rhinitis Mother    Hypertension Father    Asthma Sister    Allergic rhinitis Sister    Dementia Paternal Grandmother    Eczema Neg Hx    Urticaria Neg Hx     Social History   Socioeconomic History   Marital status: Single    Spouse name: Not on file   Number of children: 2   Years of education: Not on file   Highest education level: Not on file  Occupational History   Occupation: fulltime student    Comment: [employer]  Tobacco Use   Smoking status: Never   Smokeless tobacco: Never  Vaping Use   Vaping Use: Never used  Substance and Sexual Activity   Alcohol use: Never   Drug use: Never   Sexual activity: Yes    Partners: Male    Birth control/protection: I.U.D.  Other Topics Concern   Not on file  Social History Narrative   Lives with fiancee and her two children ( one born 2013 and 2014 )    Going to [employer] for undergraduate studies - nutrition major and wants to go to [employer], applying next Summer   Work part time - [employer] or [employer]    Social Determinants of Radio broadcast assistant Strain: Not on [employer] Insecurity: Not on file  Transportation Needs: Not on file  Physical Activity: Not on file  Stress: Not on file  Social Connections: Not on file  Intimate Partner Violence: Not on file    Outpatient Medications Prior to Visit  Medication Sig Dispense Refill   ACCU-CHEK GUIDE test strip USE UP TO FOUR TIMES DAILY     Accu-Chek Softclix Lancets  lancets SMARTSIG:Topical 1 to 4 Times Daily (Patient not taking: Reported on 07/05/2020)     blood glucose meter kit and supplies KIT Dispense based on patient and insurance preference. Use up to four times daily as directed. (FOR E16.2) (Patient not taking: Reported on 07/05/2020) 1 each 0   DULoxetine (CYMBALTA) 60 MG capsule Take 1 capsule (60 mg total) by mouth in the morning. (Patient not taking: Reported on 07/05/2020) 30 capsule 2   methimazole (TAPAZOLE) 5 MG tablet Take 1 tablet (5 mg total) by mouth daily. 30 tablet 6   pregabalin (LYRICA) 50 MG capsule Take 1-2 capsules (50-100 mg total) by mouth 2 (two) times daily. One in am and two in pm (Patient not taking: Reported on 07/05/2020) 90 capsule 2   No facility-administered medications prior to visit.    Allergies  Allergen Reactions   Codeine     ROS Review of Systems    Objective:    Physical Exam  There were no vitals taken for this visit. Wt Readings from Last 3 Encounters:  07/05/20 163 lb (73.9 kg)  05/24/20 166 lb (75.3 kg)  05/10/20 166 lb 11.2 oz (75.6 kg)     Health Maintenance Due  Topic Date Due   COVID-19 Vaccine (1) Never done   INFLUENZA VACCINE  11/06/2020   PAP-Cervical Cytology Screening  04/24/2021   PAP SMEAR-Modifier  04/24/2021    There are no preventive care reminders to display for this patient.  Lab Results  Component Value Date   TSH 0.25 (L) 07/05/2020   Lab Results  Component Value Date   WBC 3.1 (L) 07/05/2020   HGB 14.1 07/05/2020   HCT 41.8 07/05/2020   MCV 86.9 07/05/2020   PLT 255.0 07/05/2020   Lab Results  Component Value Date   NA 138 07/05/2020   K 4.4 07/05/2020   CO2 27 07/05/2020   GLUCOSE 91 07/05/2020   BUN 9 07/05/2020   CREATININE 0.71 07/05/2020   BILITOT 0.6 07/05/2020   ALKPHOS 52 07/05/2020   AST 18 07/05/2020   ALT 20 07/05/2020   PROT 8.1 07/05/2020   ALBUMIN 4.6 07/05/2020   CALCIUM 10.0 07/05/2020   ANIONGAP 9 09/08/2017   GFR 115.17  07/05/2020   Lab Results  Component Value Date   CHOL 173 02/21/2020   Lab Results  Component Value Date   HDL 35 (L) 02/21/2020   Lab Results  Component Value Date   LDLCALC 114 (H) 02/21/2020   Lab Results  Component Value Date   TRIG 129 02/21/2020   Lab Results  Component Value Date   CHOLHDL 4.9 02/21/2020   No results found for: HGBA1C    Assessment & Plan:   Problem List Items Addressed This Visit   None   No orders of the defined types were placed in this encounter.   Follow-up: No follow-ups on file.    Teodora Medici, DO

## 2021-06-01 ENCOUNTER — Telehealth (INDEPENDENT_AMBULATORY_CARE_PROVIDER_SITE_OTHER): Payer: 59 | Admitting: Internal Medicine

## 2021-06-01 ENCOUNTER — Encounter: Payer: Self-pay | Admitting: Internal Medicine

## 2021-06-01 DIAGNOSIS — M961 Postlaminectomy syndrome, not elsewhere classified: Secondary | ICD-10-CM | POA: Diagnosis not present

## 2021-06-01 NOTE — Progress Notes (Signed)
Virtual Visit via Video Note  I connected with [name] on 06/01/21 at  8:00 AM EST by a video enabled telemedicine application and verified that I am speaking with the correct person using two identifiers.  Location: Patient: Home Provider: [facility]   I discussed the limitations of evaluation and management by telemedicine and the availability of in person appointments. The patient expressed understanding and agreed to proceed.  History of Present Illness:  Melady Chow is a 31 year old female presenting over telemedicine for referrals. She has a history of lumbar radiculopathy and cervical laminectomy performed in 2011. At that time she was following with a neurosurgeon at [facility]. She is requesting a new referral to see him, as she was recently found to have low functioning thyroid and is considering medication and would like a recheck/re-imaging of her neck. She is following with Endocrinology. She also would like to start seeing physical therapy as well. She is not having any neck pain, radiculopathy or change in range of motion. She is currently not on any medications.    Observations/Objective:  General: well appearing, no acute distress ENT: conjunctiva normal appearing bilaterally  Neuro: answers all questions appropriately  Assessment and Plan:  1. Cervical post-laminectomy syndrome: Referrals placed for Neurosurgery for re-imaging and physical therapy.   - Ambulatory referral to Neurosurgery   Follow Up Instructions: PRN    I discussed the assessment and treatment plan with the patient. The patient was provided an opportunity to ask questions and all were answered. The patient agreed with the plan and demonstrated an understanding of the instructions.   The patient was advised to call back or seek an in-person evaluation if the symptoms worsen or if the condition fails to improve as anticipated.  I provided 12 minutes of non-face-to-face time during this  encounter.   Teodora Medici, DO

## 2021-06-01 NOTE — Addendum Note (Signed)
Addended by: Docia Furl on: 06/01/2021 08:24 AM   Modules accepted: Orders

## 2021-06-11 ENCOUNTER — Ambulatory Visit: Payer: Medicaid Other | Admitting: Internal Medicine

## 2021-06-11 NOTE — Progress Notes (Deleted)
? ? ?Name: Michele [name]  ?MRN/ DOB: [id number], May 05, 1990    ?Age/ Sex: 31 y.o., female   ? ?PCP: Steele Sizer, MD   ?Reason for Endocrinology Evaluation: Subclinical hyperthyroidism  ?   ?Date of Initial Endocrinology Evaluation: 07/05/2020  ? ? ?HPI: ?[name] is a 31 y.o. female with a past medical history of PSVT. The patient presented for initial endocrinology clinic visit on 07/05/2020 for consultative assistance with her subclinical hypertyhyroidism  ? ?She has been noted with intermittent low TSH since 2020 with a nadir of 0.20 uIU/mL  In 05/2020 but with normal FT4.  ? ?Was diagnosed with SVT's since high school, last time she had issues with it was during pregnancy in 2014. Was on Troprol at some point.  ? ?Has two kids born in 2013 and 2014 ? ?She was started on Methimazole 06/2020 with a TSH 0.25 uIU/mL due to hx of SVT, she was lost to follow up for ~ 1  yr until her return 06/2021 ? ? ?Unknown thyroid history in the family  ? ?SUBJECTIVE:  ? ? ? ?Today (06/11/21):  Ms. Remsburg is here for a follow up on subclinical hyperthyroidism  ? ? ?She has an IUD ?LMP 07/04/2020, regular  ? ?Methimazole 5 mg daily  ? ?HISTORY:  ?Past Medical History: No past medical history on file. ?Past Surgical History:  ?Past Surgical History:  ?Procedure Laterality Date  ? LIPOSUCTION AUOLOGOUS FAT TRANSFER TO BUTTOCKS  03/25/2019  ? in [place], Dr. Lynann Bologna   ? NECK SURGERY    ?  ?Social History:  reports that she has never smoked. She has never used smokeless tobacco. She reports that she does not drink alcohol and does not use drugs. ?Family History: family history includes Allergic rhinitis in her mother and sister; Asthma in her sister; Dementia in her paternal grandmother; Hypertension in her father and mother. ? ? ?HOME MEDICATIONS: ?Allergies as of 06/11/2021   ? ?   Reactions  ? Codeine   ? ?  ? ?  ?Medication List  ?  ? ?  ? Accurate as of June 11, 2021  7:16 AM. If you have any questions, ask your nurse or doctor.   ?  ?  ? ?  ? ?Accu-Chek Guide test strip ?Generic drug: glucose blood ?USE UP TO FOUR TIMES DAILY ?  ?methimazole 5 MG tablet ?Commonly known as: TAPAZOLE ?Take 1 tablet (5 mg total) by mouth daily. ?  ? ?  ?  ? ? ?REVIEW OF SYSTEMS: ?A comprehensive ROS was conducted with the patient and is negative except as per HPI  ? ? ? ?OBJECTIVE:  ?VS: There were no vitals taken for this visit.  ? ?Wt Readings from Last 3 Encounters:  ?07/05/20 163 lb (73.9 kg)  ?05/24/20 166 lb (75.3 kg)  ?05/10/20 166 lb 11.2 oz (75.6 kg)  ? ? ? ?EXAM: ?General: Pt appears well and is in NAD  ?Neck: General: Supple without adenopathy. ?Thyroid: Thyroid size normal.  No goiter or nodules appreciated. No thyroid bruit.  ?Lungs: Clear with good BS bilat with no rales, rhonchi, or wheezes  ?Heart: Auscultation: RRR.  ?Abdomen: Normoactive bowel sounds, soft, nontender, without masses or organomegaly palpable  ?Extremities:  ?BL LE: No pretibial edema normal ROM and strength.  ?Skin: Hair: Texture and amount normal with gender appropriate distribution ?Skin Inspection: No rashes ?Skin Palpation: Skin temperature, texture, and thickness normal to palpation  ?Neuro: Cranial nerves: II - XII grossly intact  ?Motor: Normal  strength throughout ?DTRs: 2+ and symmetric in UE without delay in relaxation phase  ?Mental Status: Judgment, insight: Intact ?Orientation: Oriented to time, place, and person ?Mood and affect: No depression, anxiety, or agitation  ? ? ? ?DATA REVIEWED: ?Results for ERIEL, DUNCKEL (MRN [id number]) as of 07/05/2020 15:04 ? Ref. Range 07/05/2020 08:18  ?Sodium Latest Ref Range: 135 - 145 mEq/L 138  ?Potassium Latest Ref Range: 3.5 - 5.1 mEq/L 4.4  ?Chloride Latest Ref Range: 96 - 112 mEq/L 104  ?CO2 Latest Ref Range: 19 - 32 mEq/L 27  ?Glucose Latest Ref Range: 70 - 99 mg/dL 91  ?BUN Latest Ref Range: 6 - 23 mg/dL 9  ?Creatinine Latest Ref Range: 0.40 - 1.20 mg/dL 0.71  ?Calcium Latest Ref Range: 8.4 - 10.5 mg/dL 10.0  ?Alkaline  Phosphatase Latest Ref Range: 39 - 117 U/L 52  ?Albumin Latest Ref Range: 3.5 - 5.2 g/dL 4.6  ?AST Latest Ref Range: 0 - 37 U/L 18  ?ALT Latest Ref Range: 0 - 35 U/L 20  ?Total Protein Latest Ref Range: 6.0 - 8.3 g/dL 8.1  ?Total Bilirubin Latest Ref Range: 0.2 - 1.2 mg/dL 0.6  ?GFR Latest Ref Range: >60.00 mL/min 115.17  ?WBC Latest Ref Range: 4.0 - 10.5 K/uL 3.1 (L)  ?RBC Latest Ref Range: 3.87 - 5.11 Mil/uL 4.81  ?Hemoglobin Latest Ref Range: 12.0 - 15.0 g/dL 14.1  ?HCT Latest Ref Range: 36.0 - 46.0 % 41.8  ?MCV Latest Ref Range: 78.0 - 100.0 fl 86.9  ?MCHC Latest Ref Range: 30.0 - 36.0 g/dL 33.7  ?RDW Latest Ref Range: 11.5 - 15.5 % 13.3  ?Platelets Latest Ref Range: 150.0 - 400.0 K/uL 255.0  ?Neutrophils Latest Ref Range: 43.0 - 77.0 % 36.9 (L)  ?Lymphocytes Latest Ref Range: 12.0 - 46.0 % 51.0 (H)  ?Monocytes Relative Latest Ref Range: 3.0 - 12.0 % 8.0  ?Eosinophil Latest Ref Range: 0.0 - 5.0 % 3.6  ?Basophil Latest Ref Range: 0.0 - 3.0 % 0.5  ?NEUT# Latest Ref Range: 1.4 - 7.7 K/uL 1.2 (L)  ?Lymphocyte # Latest Ref Range: 0.7 - 4.0 K/uL 1.6  ?Monocyte # Latest Ref Range: 0.1 - 1.0 K/uL 0.3  ?Eosinophils Absolute Latest Ref Range: 0.0 - 0.7 K/uL 0.1  ?Basophils Absolute Latest Ref Range: 0.0 - 0.1 K/uL 0.0  ?TSH Latest Ref Range: 0.35 - 4.50 uIU/mL 0.25 (L)  ?T4,Free(Direct) Latest Ref Range: 0.60 - 1.60 ng/dL 0.76  ? ? ?ASSESSMENT/PLAN/RECOMMENDATIONS:  ? ?Subclinical Hyperthyroidism: ? ?- Pt is clinically euthyroid  ?- No local neck symptoms  ?- D/D graves' disease vs autonomous thyroid nodule(s) vs subacute thyroiditis  ?- Will check TRAb  ?- Given her history of SVT , I would recommend treatment.  ?- We discussed that Graves' Disease is a result of an autoimmune condition involving the thyroid.  ? ? ?We discussed with pt the benefits of methimazole in the Tx of hyperthyroidism, as well as the possible side effects/complications of anti-thyroid drug Tx (specifically detailing the rare, but serious side  effect of agranulocytosis). She was informed of need for regular thyroid function monitoring while on methimazole to ensure appropriate dosage without over-treatment. As well, we discussed the possible side effects of methimazole including the chance of rash, the small chance of liver irritation/juandice and the <=1 in 300-400 chance of sudden onset agranulocytosis.  We discussed importance of going to ED promptly (and stopping methimazole) if shewere to develop significant fever with severe sore throat of other evidence of acute infection.    ? ? ?  We extensively discussed the various treatment options for hyperthyroidism and Graves disease including ablation therapy with radioactive iodine versus antithyroid drug treatment versus surgical therapy.  We recommended to the patient that we felt, at this time, that thionamide therapy would be most optimal.  We discussed the various possible benefits versus side effects of the various therapies.  ? ?I carefully explained to the patient that one of the consequences of I-131 ablation treatment would likely be permanent hypothyroidism which would require long-term replacement therapy with LT4. ? ? ?Medications : ?Methimazole 5 mg , 1 tablet daily  ? ? ?F/U in 4 months  ?Labs in 6 weeks  ? ? ?Signed electronically by: ?Abby Nena Jordan, MD ? ?[facility]  ?[facility] ?[address] ?[place], [place] [address] ?Phone: [phone] ?FAX: [phone] ? ? ?CC: ?Steele Sizer, MD ?[address] ?[place] [address] ?Phone: [phone] ?Fax: [phone] ? ? ?Return to Endocrinology clinic as below: ?Future Appointments  ?Date Time Provider [place]  ?06/11/2021  8:10 AM Richardo Popoff, Melanie Crazier, MD [facility] None  ?06/27/2021  9:40 AM Ancil Boozer, Drue Stager, MD [facility]  ?  ? ? ? ? ? ?

## 2021-06-12 LAB — RESULTS CONSOLE HPV: CHL HPV: NEGATIVE

## 2021-06-12 LAB — HM PAP SMEAR: HM Pap smear: NEGATIVE

## 2021-06-26 NOTE — Patient Instructions (Signed)
Preventive Care 30-30 Years Old, Female ?Preventive care refers to lifestyle choices and visits with your health care provider that can promote health and wellness. Preventive care visits are also called wellness exams. ?What can I expect for my preventive care visit? ?Counseling ?During your preventive care visit, your health care provider may ask about your: ?Medical history, including: ?Past medical problems. ?Family medical history. ?Pregnancy history. ?Current health, including: ?Menstrual cycle. ?Method of birth control. ?Emotional well-being. ?Home life and relationship well-being. ?Sexual activity and sexual health. ?Lifestyle, including: ?Alcohol, nicotine or tobacco, and drug use. ?Access to firearms. ?Diet, exercise, and sleep habits. ?Work and work Statistician. ?Sunscreen use. ?Safety issues such as seatbelt and bike helmet use. ?Physical exam ?Your health care provider may check your: ?Height and weight. These may be used to calculate your BMI (body mass index). BMI is a measurement that tells if you are at a healthy weight. ?Waist circumference. This measures the distance around your waistline. This measurement also tells if you are at a healthy weight and may help predict your risk of certain diseases, such as type 2 diabetes and high blood pressure. ?Heart rate and blood pressure. ?Body temperature. ?Skin for abnormal spots. ?What immunizations do I need? ?Vaccines are usually given at various ages, according to a schedule. Your health care provider will recommend vaccines for you based on your age, medical history, and lifestyle or other factors, such as travel or where you work. ?What tests do I need? ?Screening ?Your health care provider may recommend screening tests for certain conditions. This may include: ?Pelvic exam and Pap test. ?Lipid and cholesterol levels. ?Diabetes screening. This is done by checking your blood sugar (glucose) after you have not eaten for a while (fasting). ?Hepatitis B  test. ?Hepatitis C test. ?HIV (human immunodeficiency virus) test. ?STI (sexually transmitted infection) testing, if you are at risk. ?BRCA-related cancer screening. This may be done if you have a family history of breast, ovarian, tubal, or peritoneal cancers. ?Talk with your health care provider about your test results, treatment options, and if necessary, the need for more tests. ?Follow these instructions at home: ?Eating and drinking ? ?Eat a healthy diet that includes fresh fruits and vegetables, whole grains, lean protein, and low-fat dairy products. ?Take vitamin and mineral supplements as recommended by your health care provider. ?Do not drink alcohol if: ?Your health care provider tells you not to drink. ?You are pregnant, may be pregnant, or are planning to become pregnant. ?If you drink alcohol: ?Limit how much you have to 0-1 drink a day. ?Know how much alcohol is in your drink. In the [place] one drink equals one 12 oz bottle of beer (355 mL), one 5 oz glass of wine (148 mL), or one 1? oz glass of hard liquor (44 mL). ?Lifestyle ?Brush your teeth every morning and night with fluoride toothpaste. Floss one time each day. ?Exercise for at least 30 minutes 5 or more days each week. ?Do not use any products that contain nicotine or tobacco. These products include cigarettes, chewing tobacco, and vaping devices, such as e-cigarettes. If you need help quitting, ask your health care provider. ?Do not use drugs. ?If you are sexually active, practice safe sex. Use a condom or other form of protection to prevent STIs. ?If you do not wish to become pregnant, use a form of birth control. If you plan to become pregnant, see your health care provider for a prepregnancy visit. ?Find healthy ways to manage stress, such as: ?Meditation, yoga,  or listening to music. ?Journaling. ?Talking to a trusted person. ?Spending time with friends and family. ?Minimize exposure to UV radiation to reduce your risk of skin  cancer. ?Safety ?Always wear your seat belt while driving or riding in a vehicle. ?Do not drive: ?If you have been drinking alcohol. Do not ride with someone who has been drinking. ?If you have been using any mind-altering substances or drugs. ?While texting. ?When you are tired or distracted. ?Wear a helmet and other protective equipment during sports activities. ?If you have firearms in your house, make sure you follow all gun safety procedures. ?Seek help if you have been physically or sexually abused. ?What's next? ?Go to your health care provider once a year for an annual wellness visit. ?Ask your health care provider how often you should have your eyes and teeth checked. ?Stay up to date on all vaccines. ?This information is not intended to replace advice given to you by your health care provider. Make sure you discuss any questions you have with your health care provider. ?Document Revised: 09/20/2020 Document Reviewed: 09/20/2020 ?Elsevier Patient Education ? [place]. ? ?

## 2021-06-26 NOTE — Progress Notes (Signed)
Name: Michele [name]   MRN: [id number]    DOB: December 02, 1990   Date:06/27/2021 ? ?     Progress Note ? ?Subjective ? ?Chief Complaint ? ?Annual Exam ? ?HPI ? ?Patient presents for annual CPE. ? ?Diet: eating more vegetables, stopped eating dairy, eats lean meat  ?Exercise: continue regular physical activity   ? ?Flowsheet Row Video Visit from 05/24/2020 in [facility]  ?AUDIT-C Score 0  ? ?  ? ?Depression: Phq 9 is  negative ? ?  06/27/2021  ?  9:38 AM 06/01/2021  ?  7:51 AM 05/24/2020  ?  8:27 AM 05/10/2020  ?  8:22 AM 05/10/2020  ?  8:08 AM  ?Depression screen PHQ 2/9  ?Decreased Interest 1 0 1 1 0  ?Down, Depressed, Hopeless 1 0 1 1 0  ?PHQ - 2 Score 2 0 2 2 0  ?Altered sleeping 0 0 0 0   ?Tired, decreased energy _0 ?Change in appetite 0 0 1 1   ?Feeling bad or failure about yourself  0 0 0 0   ?Trouble concentrating 3 0 2 2   ?Moving slowly or fidgety/restless 0 0 0 0   ?Suicidal thoughts 0 0 0 0   ?PHQ-9 Score _1 ?Difficult doing work/chores  Not difficult at all Somewhat difficult Somewhat difficult   ? ?Hypertension: ?BP Readings from Last 3 Encounters:  ?06/27/21 116/74  ?07/05/20 114/82  ?05/10/20 112/80  ? ?Obesity: ?Wt Readings from Last 3 Encounters:  ?06/27/21 163 lb (73.9 kg)  ?07/05/20 163 lb (73.9 kg)  ?05/24/20 166 lb (75.3 kg)  ? ?BMI Readings from Last 3 Encounters:  ?06/27/21 29.81 kg/m?  ?07/05/20 29.81 kg/m?  ?05/24/20 30.36 kg/m?  ?  ? ?Vaccines:  ? ?HPV: she thinks she got is a child  ?Tdap: up to date ?Shingrix: N/A ?Pneumonia: N/A ?Flu: refused  ?COVID-19: discussed with patient  ? ? ?Hep C Screening: 04/24/18 ?STD testing and prevention (HIV/chl/gon/syphilis): 03/15/19 ?Intimate partner violence: negative ?Sexual History : same partner , no pain  ?Menstrual History/LMP/Abnormal Bleeding: she has IUD, sees gyn had pelvic US  ?Discussed importance of follow up if any post-menopausal bleeding: not applicable ?Incontinence Symptoms: No. ? ?Breast cancer:  ?- Last Mammogram:  N/A ?- BRCA gene screening: N/A  ? ?Osteoporosis Prevention : Discussed high calcium and vitamin D supplementation, weight bearing exercises ?Bone density :not applicable ? ?Cervical cancer screening: Ordered today ? ?Skin cancer: Discussed monitoring for atypical lesions  ?Colorectal cancer: N/A   ?Lung cancer:  Low Dose CT Chest recommended if Age 51-80 years, 20 pack-year currently smoking OR have quit w/in 15years. Patient does not qualify.   ?ECG: 03/15/19 ? ?Advanced Care Planning: A voluntary discussion about advance care planning including the explanation and discussion of advance directives.  Discussed health care proxy and Living will, and the patient was able to identify a health care proxy as significant other - Ananias Pilgrim  ? ?Lipids: ?Lab Results  ?Component Value Date  ? CHOL 173 02/21/2020  ? CHOL 172 04/24/2018  ? ?Lab Results  ?Component Value Date  ? HDL 35 (L) 02/21/2020  ? HDL 33 (L) 04/24/2018  ? ?Lab Results  ?Component Value Date  ? LDLCALC 114 (H) 02/21/2020  ? LDLCALC 112 (H) 04/24/2018  ? ?Lab Results  ?Component Value Date  ? TRIG 129 02/21/2020  ? TRIG 152 (H) 04/24/2018  ? ?Lab Results  ?Component Value Date  ? CHOLHDL  4.9 02/21/2020  ? CHOLHDL 5.2 (H) 04/24/2018  ? ?No results found for: LDLDIRECT ? ?Glucose: ?Glucose, Bld  ?Date Value Ref Range Status  ?07/05/2020 91 70 - 99 mg/dL Final  ?02/21/2020 64 (L) 65 - 139 mg/dL Final  ?  Comment:  ?  . ?       Non-fasting reference interval ?. ?  ?03/15/2019 93 65 - 99 mg/dL Final  ?  Comment:  ?  . ?           Fasting reference interval ?. ?  ? ? ?Patient Active Problem List  ? Diagnosis Date Noted  ? Cervical post-laminectomy syndrome 05/01/2020  ? PSVT (paroxysmal supraventricular tachycardia) ([place]) 01/18/2019  ? Memory changes 04/24/2018  ? Left lumbar radiculitis 04/24/2018  ? ? ?Past Surgical History:  ?Procedure Laterality Date  ? LIPOSUCTION AUOLOGOUS FAT TRANSFER TO BUTTOCKS  03/25/2019  ? in [place], Dr. Lynann Bologna   ? NECK SURGERY     ? ? ?Family History  ?Problem Relation Age of Onset  ? Hypertension Mother   ? Allergic rhinitis Mother   ? Hypertension Father   ? Asthma Sister   ? Allergic rhinitis Sister   ? Dementia Paternal Grandmother   ? Eczema Neg Hx   ? Urticaria Neg Hx   ? ? ?Social History  ? ?Socioeconomic History  ? Marital status: Single  ?  Spouse name: Not on file  ? Number of children: 2  ? Years of education: Not on file  ? Highest education level: Not on file  ?Occupational History  ? Occupation: Sports administrator  ?  Comment: [employer]  ?Tobacco Use  ? Smoking status: Never  ? Smokeless tobacco: Never  ?Vaping Use  ? Vaping Use: Never used  ?Substance and Sexual Activity  ? Alcohol use: Never  ? Drug use: Never  ? Sexual activity: Yes  ?  Partners: Male  ?  Birth control/protection: I.U.D.  ?Other Topics Concern  ? Not on file  ?Social History Narrative  ? Lives with fiancee and her two children ( one born 2013 and 2014 )   ? Going to [employer] for undergraduate studies - nutrition major and wants to go to [employer], applying next Summer  ? Work part time - Acupuncturist   ? ?Social Determinants of Health  ? ?Financial Resource Strain: Low Risk   ? Difficulty of Paying Living Expenses: Not hard at all  ?Food Insecurity: No Food Insecurity  ? Worried About Lizmarie fundraiser in the Last Year: Never true  ? Ran Out of Food in the Last Year: Never true  ?Transportation Needs: Unmet Transportation Needs  ? Lack of Transportation (Medical): Yes  ? Lack of Transportation (Non-Medical): No  ?Physical Activity: Sufficiently Active  ? Days of Exercise per Week: 5 days  ? Minutes of Exercise per Session: 60 min  ?Stress: Stress Concern Present  ? Feeling of Stress : Rather much  ?Social Connections: Moderately Isolated  ? Frequency of Communication with Friends and Family: More than three times a week  ? Frequency of Social Gatherings with Friends and Family: Never  ? Attends Religious Services: Never  ? Active Member of Clubs  or Organizations: No  ? Attends Archivist Meetings: Never  ? Marital Status: Living with partner  ?Intimate Partner Violence: Not At Risk  ? Fear of Current or Ex-Partner: No  ? Emotionally Abused: No  ? Physically Abused: No  ? Sexually Abused: No  ? ? ? ?  Current Outpatient Medications:  ?  ACCU-CHEK GUIDE test strip, USE UP TO FOUR TIMES DAILY (Patient not taking: Reported on 06/27/2021), Disp: , Rfl:  ?  methimazole (TAPAZOLE) 5 MG tablet, Take 1 tablet (5 mg total) by mouth daily. (Patient not taking: Reported on 06/27/2021), Disp: 30 tablet, Rfl: 6 ? ?Allergies  ?Allergen Reactions  ? Codeine   ? ? ? ?ROS ? ?Constitutional: Negative for fever or weight change.  ?Respiratory: Negative for cough and shortness of breath.   ?Cardiovascular: Negative for chest pain or palpitations.  ?Gastrointestinal: Negative for abdominal pain, no bowel changes.  ?Musculoskeletal: Negative for gait problem or joint swelling.  ?Skin: Negative for rash.  ?Neurological: Negative for dizziness or headache.  ?No other specific complaints in a complete review of systems (except as listed in HPI above).  ? ?Objective ? ?Vitals:  ? 06/27/21 0939  ?BP: 116/74  ?Pulse: 92  ?Resp: 16  ?SpO2: 99%  ?Weight: 163 lb (73.9 kg)  ?Height: _0  (1.575 m)  ? ? ?Body mass index is 29.81 kg/m?. ? ?Physical Exam ? ?Constitutional: Patient appears well-developed and well-nourished. No distress.  ?HENT: Head: Normocephalic and atraumatic, ptosis of right eyelid . Ears: B TMs ok, no erythema or effusion; Nose: Not done l. Mouth/Throat: not done  ?Eyes: Conjunctivae and EOM are normal. Pupils are equal, round, and reactive to light. No scleral icterus.  ?Neck: Normal range of motion. Neck supple. No JVD present. No thyromegaly present.  ?Cardiovascular: Normal rate, regular rhythm and normal heart sounds.  No murmur heard. No BLE edema. ?Pulmonary/Chest: Effort normal and breath sounds normal. No respiratory distress. ?Abdominal: Soft. Bowel  sounds are normal, no distension. There is no tenderness. no masses ?Breast: no lumps or masses, no nipple discharge or rashes ?FEMALE GENITALIA:  ?Not done  ?RECTAL: not done  ?Musculoskeletal: Normal range of m

## 2021-06-27 ENCOUNTER — Ambulatory Visit: Payer: 59 | Admitting: Family Medicine

## 2021-06-27 ENCOUNTER — Encounter: Payer: Self-pay | Admitting: Family Medicine

## 2021-06-27 VITALS — BP 116/74 | HR 92 | Resp 16 | Ht 62.0 in | Wt 163.0 lb

## 2021-06-27 DIAGNOSIS — Z0000 Encounter for general adult medical examination without abnormal findings: Secondary | ICD-10-CM | POA: Diagnosis not present

## 2021-06-27 DIAGNOSIS — H02401 Unspecified ptosis of right eyelid: Secondary | ICD-10-CM | POA: Diagnosis not present

## 2021-06-27 DIAGNOSIS — D708 Other neutropenia: Secondary | ICD-10-CM | POA: Diagnosis not present

## 2021-06-27 DIAGNOSIS — Z124 Encounter for screening for malignant neoplasm of cervix: Secondary | ICD-10-CM

## 2021-06-27 DIAGNOSIS — E0590 Thyrotoxicosis, unspecified without thyrotoxic crisis or storm: Secondary | ICD-10-CM | POA: Diagnosis not present

## 2021-06-27 DIAGNOSIS — Z13220 Encounter for screening for lipoid disorders: Secondary | ICD-10-CM

## 2021-06-27 DIAGNOSIS — Z131 Encounter for screening for diabetes mellitus: Secondary | ICD-10-CM

## 2021-06-28 LAB — COMPLETE METABOLIC PANEL WITH GFR
AG Ratio: 1.5 (calc) (ref 1.0–2.5)
ALT: 24 U/L (ref 6–29)
AST: 16 U/L (ref 10–30)
Albumin: 4.9 g/dL (ref 3.6–5.1)
Alkaline phosphatase (APISO): 54 U/L (ref 31–125)
BUN: 15 mg/dL (ref 7–25)
CO2: 26 mmol/L (ref 20–32)
Calcium: 10 mg/dL (ref 8.6–10.2)
Chloride: 105 mmol/L (ref 98–110)
Creat: 0.78 mg/dL (ref 0.50–0.97)
Globulin: 3.3 g/dL (calc) (ref 1.9–3.7)
Glucose, Bld: 81 mg/dL (ref 65–99)
Potassium: 4.4 mmol/L (ref 3.5–5.3)
Sodium: 140 mmol/L (ref 135–146)
Total Bilirubin: 0.3 mg/dL (ref 0.2–1.2)
Total Protein: 8.2 g/dL — ABNORMAL HIGH (ref 6.1–8.1)
eGFR: 105 mL/min/{1.73_m2} (ref >=60)

## 2021-06-28 LAB — CBC WITH DIFFERENTIAL/PLATELET
Absolute Monocytes: 332 cells/uL (ref 200–950)
Basophils Absolute: 42 cells/uL (ref 0–200)
Basophils Relative: 1 %
Eosinophils Absolute: 353 cells/uL (ref 15–500)
Eosinophils Relative: 8.4 %
HCT: 43.6 % (ref 35.0–45.0)
Hemoglobin: 14.4 g/dL (ref 11.7–15.5)
Lymphs Abs: 2092 cells/uL (ref 850–3900)
MCH: 29.5 pg (ref 27.0–33.0)
MCHC: 33 g/dL (ref 32.0–36.0)
MCV: 89.3 fL (ref 80.0–100.0)
MPV: 10.2 fL (ref 7.5–12.5)
Monocytes Relative: 7.9 %
Neutro Abs: 1382 cells/uL — ABNORMAL LOW (ref 1500–7800)
Neutrophils Relative %: 32.9 %
Platelets: 264 10*3/uL (ref 140–400)
RBC: 4.88 10*6/uL (ref 3.80–5.10)
RDW: 12.2 % (ref 11.0–15.0)
Total Lymphocyte: 49.8 %
WBC: 4.2 10*3/uL (ref 3.8–10.8)

## 2021-06-28 LAB — HEMOGLOBIN A1C
Hgb A1c MFr Bld: 5.5 % of total Hgb (ref <5.7)
Mean Plasma Glucose: 111 mg/dL
eAG (mmol/L): 6.2 mmol/L

## 2021-06-28 LAB — T3: T3, Total: 101 ng/dL (ref 76–181)

## 2021-06-28 LAB — TSH: TSH: 0.38 mIU/L — ABNORMAL LOW

## 2021-06-28 LAB — LIPID PANEL
Cholesterol: 153 mg/dL (ref <200)
HDL: 47 mg/dL — ABNORMAL LOW (ref >=50)
LDL Cholesterol (Calc): 96 mg/dL (calc)
Non-HDL Cholesterol (Calc): 106 mg/dL (calc) (ref <130)
Total CHOL/HDL Ratio: 3.3 (calc) (ref <5.0)
Triglycerides: 35 mg/dL (ref <150)

## 2021-06-28 LAB — T4, FREE: Free T4: 1 ng/dL (ref 0.8–1.8)

## 2021-06-29 ENCOUNTER — Encounter: Payer: Self-pay | Admitting: Internal Medicine

## 2021-06-29 ENCOUNTER — Other Ambulatory Visit: Payer: Self-pay

## 2021-06-29 ENCOUNTER — Ambulatory Visit (INDEPENDENT_AMBULATORY_CARE_PROVIDER_SITE_OTHER): Payer: 59 | Admitting: Internal Medicine

## 2021-06-29 VITALS — BP 112/70 | HR 88 | Ht 62.0 in | Wt 162.0 lb

## 2021-06-29 DIAGNOSIS — E0590 Thyrotoxicosis, unspecified without thyrotoxic crisis or storm: Secondary | ICD-10-CM

## 2021-06-29 NOTE — Patient Instructions (Signed)
Thyroid Uptake and Scan works like this: We would first check a thyroid "scan" (a special, but easy and painless type of thyroid x ray).  you go to the x-ray department of the hospital to swallow a pill, which contains a miniscule amount of radiation.  You will not notice any symptoms from this.  You will go back to the x-ray department the next day, to lie down in front of a camera.  The results of this will be sent to me.    

## 2021-06-29 NOTE — Progress Notes (Signed)
? ? ?Name: Michele [name]  ?MRN/ DOB: [id number], 1991/02/16    ?Age/ Sex: 31 y.o., female   ? ?PCP: Steele Sizer, MD   ?Reason for Endocrinology Evaluation: Subclinical hyperthyroidism  ?   ?Date of Initial Endocrinology Evaluation: 07/05/2020  ? ? ?HPI: ?Michele [name] is a 31 y.o. female with a past medical history of PSVT. The patient presented for initial endocrinology clinic visit on 07/05/2020 for consultative assistance with her subclinical hypertyhyroidism  ? ?She has been noted with intermittent low TSH since 2020 with a nadir of 0.20 uIU/mL  In 05/2020 but with normal FT4.  ? ?Was diagnosed with SVT's since high school, last time she had issues with it was during pregnancy in 2014. Was on Troprol at some point.  ? ?Has two kids born in 2013 and 2014 ? ?She was started on Methimazole 06/2020 with a TSH 0.25 uIU/mL due to hx of SVT, she was lost to follow up for ~ 1  yr until her return 06/2021 ? ? ?Unknown thyroid history in the family  ? ? ?Pt with hx of neutropenia prior to starting methimazole , neutropenia dates back to 2021 .  ? ?SUBJECTIVE:  ? ? ? ?Today (06/29/21):  Ms. [name] is here for a follow up on subclinical hyperthyroidism .She has NOT been to our clinic in 12 months.  ? ?She thought she could manage her thyroid with lifestyle changes but this did not help  ? ? ?She is tired , has mood swings  ?Denies recent palpitations  ?Has occasional tremors  ?Weight tends to fluctuate  ?She has an IUD ?She has noted spotting  ? ?Pt has a neck ?cyst in 2001 , requiring sx followed by scarring and requiring another sx 2011 . Surgery was complicated by right eye ptosis  ? ? ? ? ? ?HISTORY:  ?Past Medical History: No past medical history on file. ?Past Surgical History:  ?Past Surgical History:  ?Procedure Laterality Date  ? CERVICAL LAMINECTOMY  2001  ? CERVICAL SPINE SURGERY  2011  ? scar tissue removal  ? LIPOSUCTION AUOLOGOUS FAT TRANSFER TO BUTTOCKS  03/25/2019  ? in [place], Dr. Lynann Bologna   ?  ?Social  History:  reports that she has never smoked. She has never used smokeless tobacco. She reports that she does not drink alcohol and does not use drugs. ?Family History: family history includes Allergic rhinitis in her mother and sister; Asthma in her sister; Dementia in her paternal grandmother; Hypertension in her father and mother. ? ? ?HOME MEDICATIONS: ?Allergies as of 06/29/2021   ? ?   Reactions  ? Codeine   ? ?  ? ?  ?Medication List  ?  ? ?  ? Accurate as of June 29, 2021 11:27 AM. If you have any questions, ask your nurse or doctor.  ?  ?  ? ?  ? ?methimazole 5 MG tablet ?Commonly known as: TAPAZOLE ?Take 1 tablet (5 mg total) by mouth daily. ?  ? ?  ?  ? ? ?REVIEW OF SYSTEMS: ?A comprehensive ROS was conducted with the patient and is negative except as per HPI  ? ? ? ?OBJECTIVE:  ?VS: BP 112/70 (BP Location: Left Arm, Patient Position: Sitting, Cuff Size: Small)   Pulse 88   Ht '5\' 2"'$  (1.575 m)   Wt 162 lb (73.5 kg)   SpO2 97%   BMI 29.63 kg/m?   ? ?Wt Readings from Last 3 Encounters:  ?06/29/21 162 lb (73.5 kg)  ?06/27/21 163  lb (73.9 kg)  ?07/05/20 163 lb (73.9 kg)  ? ? ? ?EXAM: ?General: Pt appears well and is in NAD  ?Neck: General: Supple without adenopathy. ?Thyroid: Thyroid size normal.  No goiter or nodules appreciated. No thyroid bruit.  ?Lungs: Clear with good BS bilat with no rales, rhonchi, or wheezes  ?Heart: Auscultation: RRR.  ?Abdomen: Normoactive bowel sounds, soft, nontender, without masses or organomegaly palpable  ?Extremities:  ?BL LE: No pretibial edema normal ROM and strength.  ?Skin: Hair: Texture and amount normal with gender appropriate distribution ?Skin Inspection: No rashes ?Skin Palpation: Skin temperature, texture, and thickness normal to palpation  ?Neuro: Cranial nerves: II - XII grossly intact  ?Motor: Normal strength throughout ?DTRs: 2+ and symmetric in UE without delay in relaxation phase  ?Mental Status: Judgment, insight: Intact ?Orientation: Oriented to time,  place, and person ?Mood and affect: No depression, anxiety, or agitation  ? ? ? ?DATA REVIEWED: ? ? Latest Reference Range & Units 06/27/21 10:24  ?WBC 3.8 - 10.8 Thousand/uL 4.2  ?RBC 3.80 - 5.10 Million/uL 4.88  ?Hemoglobin 11.7 - 15.5 g/dL 14.4  ?HCT 35.0 - 45.0 % 43.6  ?MCV 80.0 - 100.0 fL 89.3  ?MCH 27.0 - 33.0 pg 29.5  ?MCHC 32.0 - 36.0 g/dL 33.0  ?RDW 11.0 - 15.0 % 12.2  ?Platelets 140 - 400 Thousand/uL 264  ?MPV 7.5 - 12.5 fL 10.2  ?Neutrophils % 32.9  ?Monocytes Relative % 7.9  ?Eosinophil % 8.4  ?Basophil % 1.0  ?NEUT# 1,500 - 7,800 cells/uL 1,382 (L)  ?Lymphocyte # 850 - 3,900 cells/uL 2,092  ?Total Lymphocyte % 49.8  ?Eosinophils Absolute 15 - 500 cells/uL 353  ?Basophils Absolute 0 - 200 cells/uL 42  ?Absolute Monocytes 200 - 950 cells/uL 332  ? ?ASSESSMENT/PLAN/RECOMMENDATIONS:  ? ?Subclinical Hyperthyroidism: ? ?- Pt with multiple nonspecific symptoms, she was lost to follow-up a year ago she never did start methimazole ?-She has been working on lifestyle changes but that did not help with her thyroid ?- No local neck symptoms  ?- D/D graves' disease vs autonomous thyroid nodule(s) ?- TRAb undetectable  ?-We initially recommended methimazole due to her history of SVT, I have advised the patient that it is best to proceed with thyroid uptake and scan at this time ?-Patient would like this to be done at work as per regional ? ? ? ?F/U in 6 months  ? ? ? ?Signed electronically by: ?Abby Nena Jordan, MD ? ?[facility]  ?[facility] ?[address] ?[place], [place] [address] ?Phone: [phone] ?FAX: [phone] ? ? ?CC: ?Steele Sizer, MD ?[address] ?[place] [address] ?Phone: [phone] ?Fax: [phone] ? ? ?Return to Endocrinology clinic as below: ?Future Appointments  ?Date Time Provider [place]  ?07/17/2021  8:00 AM Teodora Medici, DO [facility]  ?  ? ? ? ? ? ?

## 2021-07-17 ENCOUNTER — Telehealth (INDEPENDENT_AMBULATORY_CARE_PROVIDER_SITE_OTHER): Payer: 59 | Admitting: Internal Medicine

## 2021-07-17 DIAGNOSIS — E0590 Thyrotoxicosis, unspecified without thyrotoxic crisis or storm: Secondary | ICD-10-CM

## 2021-07-17 DIAGNOSIS — F439 Reaction to severe stress, unspecified: Secondary | ICD-10-CM | POA: Diagnosis not present

## 2021-07-17 NOTE — Progress Notes (Signed)
Virtual Visit via Video Note ? ?I connected with Michele [name] on 07/17/21 at  8:00 AM EDT by a video enabled telemedicine application and verified that I am speaking with the correct person using two identifiers. ? ?Location: ?Patient: Home ?Provider: [facility] ?  ?I discussed the limitations of evaluation and management by telemedicine and the availability of in person appointments. The patient expressed understanding and agreed to proceed. ? ?History of Present Illness: ? ?Michele [name] is a 31 year old female presenting over telemedicine for follow up.  ? ?Hyperthyroidism: ?-Following with Endocrinology, discussed starting Methimazole but she hasn't started taking it yet  ?-TSH 3/23 0.38, free T4 normal at 1.0, T3 also normal at 101 ?-Does occasionally get heat intolerance symptoms but denies palpitations, diarrhea, vision changes. Occasional anxiety, but very stressed (see below) ?-Thyroid reuptake scan ordered but not yet scheduled ?-Neurosurgery appointment on 4/24 to recheck cervical cyst which may be affecting thyroid ? ?Stress: ?-Going to school and working full time, is a mom ?-Feeling stressed and over-whelmed with everything going on, would like to discuss therapy options ? ?Health Maintenance: ?-Blood work up to date ? ?Observations/Objective: ? ?General: well appearing, no acute distress ?ENT: conjunctiva normal appearing bilaterally  ?Neuro: answers all questions appropriately ? ?Assessment and Plan: ? ?1. Stress: Referral to social work placed to discuss therapy options.  ? ?- AMB Referral to [facility] ? ?2. Hyperthyroidism, subclinical: Reviewed labs with the patient and discussed importance in obtaining thyroid scan. She is planning on following up with Endocrinologist after scan results. Follow up here in 6 months for recheck.  ? ?Follow Up Instructions: 6 months ? ?  ?I discussed the assessment and treatment plan with the patient. The patient was provided an opportunity to ask  questions and all were answered. The patient agreed with the plan and demonstrated an understanding of the instructions. ?  ?The patient was advised to call back or seek an in-person evaluation if the symptoms worsen or if the condition fails to improve as anticipated. ? ?I provided 14 minutes of non-face-to-face time during this encounter. ? ? ?Teodora Medici, DO ? ?

## 2021-07-17 NOTE — Patient Instructions (Signed)
It was great seeing you today! ? ?Plan discussed at today's visit: ?-Follow up on thyroid scan and with Endocrinologist ?-Neuro surgery 4/24 ?-Referral placed to social work to discuss counseling services  ? ?Follow up in: 6 months in person  ? ?Take care and let us know if you have any questions or concerns prior to your next visit. ? ?Dr. Rosana Berger ? ?

## 2021-07-24 ENCOUNTER — Other Ambulatory Visit: Payer: Self-pay | Admitting: Licensed Clinical Social Worker

## 2021-07-24 NOTE — Patient Outreach (Signed)
Medicaid Managed Care Social Work Note  07/24/2021 Name:  Michele [name] MRN:  [id number] DOB:  05/28/1990  Michele [name] is an 31 y.o. year old female who is a primary patient of Michele Cory, MD.  The Medicaid Managed Care Coordination team was consulted for assistance with:  Mental Health Counseling and Resources  Michele [name] was given information about Medicaid Managed Care Coordination team services today. Michele [name] Patient agreed to services and verbal consent obtained.  Engaged with patient  for by telephone forinitial visit in response to referral for case management and/or care coordination services.   Assessments/Interventions:  Review of past medical history, allergies, medications, health status, including review of consultants reports, laboratory and other test data, was performed as part of comprehensive evaluation and provision of chronic care management services.  SDOH: (Social Determinant of Health) assessments and interventions performed: SDOH Interventions    Flowsheet Row Most Recent Value  SDOH Interventions   Stress Interventions Offered [employer], Provide Counseling       Advanced Directives Status:  See Care Plan for related entries.  Care Plan                 Allergies  Allergen Reactions   Codeine     Medications Reviewed Today     Reviewed by Michele Mail, DO (Physician) on 07/17/21 at 0856  Med List Status: <None>   Medication Order Taking? Sig Documenting Provider Last Dose Status Informant  methimazole (TAPAZOLE) 5 MG tablet [id number] No Take 1 tablet (5 mg total) by mouth daily.  Patient not taking: Reported on 06/27/2021   Shamleffer, Konrad Dolores, MD Not Taking Active             Patient Active Problem List   Diagnosis Date Noted   Hyperthyroidism, subclinical 06/27/2021   Acquired ptosis of right eyelid 06/27/2021   Cervical post-laminectomy syndrome 05/01/2020   PSVT (paroxysmal supraventricular  tachycardia) (HCC) 01/18/2019   Memory changes 04/24/2018   Left lumbar radiculitis 04/24/2018    Conditions to be addressed/monitored per PCP order:  Anxiety  Care Plan : LCSW Plan of Care  Updates made by Gustavus Bryant, LCSW since 07/24/2021 12:00 AM     Problem: Anxiety Identification (Anxiety)      Long-Range Goal: I want to decrease my anxiety by starting counseling   Start Date: 07/24/2021  Priority: High  Note:   Priority: High  Timeframe:  Long-Range Goal Priority:  High Start Date:   07/24/20               Expected End Date:  ongoing                     Follow Up Date--08/20/21  - check out counseling - keep 90 percent of counseling appointments - schedule counseling appointment    Why is this important?             Beating depression may take some time.            If you don't feel better right away, don't give up on your treatment plan.    Current barriers:   Chronic Mental Health needs related to stress and anxiety. Patient requires Support, Education, Resources, Referrals, Advocacy, and Care Coordination, in order to meet Unmet Mental Health Needs. Patient will implement clinical interventions discussed today to decrease symptoms of anxiety and increase knowledge and/or ability of: coping skills. Mental Health Concerns and Social Isolation Patient lacks knowledge of available community  counseling agencies and resources.  Clinical Goal(s): verbalize understanding of plan for management of Anxiety, and Stress and demonstrate a reduction in symptoms. Patient will connect with a provider for ongoing mental health treatment, increase coping skills, healthy habits, self-management skills, and stress reduction        Clinical Interventions:  Assessed patient's previous and current treatment, coping skills, support system and barriers to care.  Verbalization of feelings encouraged, motivational interviewing employed Emotional support provided, positive coping  strategies explored Patient reports significant worsening anxiety impacting her ability to function appropriately and carry out daily task. Patient endorsed need for counseling. Patient does not want medication therapy at this time. [facility] LCSW provided resource education. [facility] LCSW and patient completed joint call to [facility] and was informed that they only do group counseling at this time. Voice message was left with supervisor Ginger. [facility] LCSW and patient completed joint call to [employer] and was informed that they do accept patient's insurance and can take her counseling. Email sent to patient with mental health resources that were reviewed with [facility] LCSW.  Patient receives strong support from spouse. LCSW provided education on relaxation techniques such as meditation, deep breathing, massage, grounding exercises or yoga that can activate the body's relaxation response and ease symptoms of stress and anxiety. LCSW ask that when pt is struggling with difficult emotions and racing thoughts that they start this relaxation response process. LCSW provided extensive education on healthy coping skills for anxiety. SW used active and reflective listening, validated patient's feelings/concerns, and provided emotional support. Patient will work on implementing appropriate self-care habits into their daily routine such as: staying positive, writing a gratitude list, drinking water, staying active around the house, taking their medications as prescribed, combating negative thoughts or emotions and staying connected with their family and friends. Motivational Interviewing employed Depression screen reviewed  PHQ2/ PHQ9 completed Mindfulness or Relaxation training provided Active listening / Reflection utilized  Advance Care and HCPOA education provided Emotional Support Provided Problem Solving /Task Center strategies reviewed Provided psychoeducation for mental health needs  Provided  brief CBT  Reviewed mental health medications and discussed importance of compliance:  Quality of sleep assessed & Sleep Hygiene techniques promoted  Participation in counseling encouraged  Verbalization of feelings encouraged  Suicidal Ideation/Homicidal Ideation assessed: Patient denies SI/HI  Review resources, discussed options and provided patient information about  Mental Health Resources Inter-disciplinary care team collaboration (see longitudinal plan of care) Patient Goals/Self-Care Activities: Over the next 120 days Attend scheduled medical appointments Utilize healthy coping skills and supportive resources discussed Contact PCP with any questions or concerns Keep 90 percent of counseling appointments Call your insurance provider for more information about your Enhanced Benefits  Check out counseling resources provided  Begin personal counseling with LCSW, to reduce and manage symptoms of Anxiety and Stress, until well-established with mental health provider Incorporate into daily practice - relaxation techniques, deep breathing exercises, and mindfulness meditation strategies. Talk about feelings with friends, family members, spiritual advisor, etc. Contact LCSW directly [phone]), if you have questions, need assistance, or if additional social work needs are identified between now and our next scheduled telephone outreach call. Call [phone] for mental health hotline/crisis line if needed (24/7 available) Try techniques to reduce symptoms of anxiety/negative thinking (deep breathing, distraction, positive self talk, etc)  - develop a personal safety plan - develop a plan to deal with triggers like holidays, anniversaries - exercise at least 2 to 3 times per week - have  a plan for how to handle bad days - journal feelings and what helps to feel better or worse - spend time or talk with others at least 2 to 3 times per week - watch for early signs of feeling worse - begin  personal counseling - call and visit an old friend - check out volunteer opportunities - join a support group - laugh; watch a funny movie or comedian - learn and use visualization or guided imagery - perform a random act of kindness - practice relaxation or meditation daily - start or continue a personal journal - practice positive thinking and self-talk -continue with compliance of taking medication      07/24/2021    9:43 AM 07/17/2021    8:01 AM 06/27/2021    9:38 AM 06/01/2021    7:51 AM 05/24/2020    8:27 AM  Depression screen PHQ 2/9  Decreased Interest 0 0 1 0 1  Down, Depressed, Hopeless 0 0 1 0 1  PHQ - 2 Score 0 0 2 0 2  Altered sleeping 0 0 0 0 0  Tired, decreased energy 0 0 3 3 1   Change in appetite 1 0 0 0 1  Feeling bad or failure about yourself  0 0 0 0 0  Trouble concentrating 0 0 3 0 2  Moving slowly or fidgety/restless 0 0 0 0 0  Suicidal thoughts 0 0 0 0 0  PHQ-9 Score 1 0 8 3 6   Difficult doing work/chores Somewhat difficult Not difficult at all  Not difficult at all Somewhat difficult   Patient Goals: Initial goal      Follow up:  Patient agrees to Care Plan and Follow-up.  Plan: The Managed Medicaid care management team will reach out to the patient again over the next 30 days.  Date/time of next scheduled Social Work care management/care coordination outreach:  08/20/21 at 9:30 am.  Dickie La, BSW, MSW, LCSW Managed Medicaid LCSW [facility]  [facility] [email or web address] Phone: [phone]

## 2021-07-24 NOTE — Patient Instructions (Signed)
Visit Information ? ?Ms. Byrns was given information about Medicaid Managed Care team care coordination services as a part of their [facility] Medicaid benefit. Caiya Schoof verbally consented to engagement with the [facility] Managed Care team.  ? ?If you are experiencing a medical emergency, please call 911 or report to your local emergency department or urgent care.  ? ?If you have a non-emergency medical problem during routine business hours, please contact your provider's office and ask to speak with a nurse.  ? ?For questions related to your [facility] health plan, please call: [phone] or go here:[email or web address] ? ?If you would like to schedule transportation through your [facility] plan, please call the following number at least 2 days in advance of your appointment: [phone]. ? You can also use the MTM portal or MTM mobile app to manage your rides. For the portal, please go to [email or web address]. ? ?Call the [place] at [phone], at any time, 24 hours a day, 7 days a week. If you are in danger or need immediate medical attention call 911. ? ?If you would like help to quit smoking, call 1-800-QUIT-NOW 215-791-3660) OR Espa?ol: 1-855-D?jelo-Ya 972-006-1963) o para m?s informaci?n haga clic aqu? or Text READY to 200-400 to register via text ? ?Following is a copy of your plan of care:  ?Care Plan : [place]  ?Updates made by Greg Cutter, LCSW since 07/24/2021 12:00 AM  ?  ? ?Problem: Anxiety Identification (Anxiety)   ?  ? ?Long-Range Goal: I want to decrease my anxiety by starting counseling   ?Start Date: 07/24/2021  ?Priority: High  ?Note:   ?Priority: High ? ?Timeframe:  Long-Range Goal ?Priority:  High ?Start Date:   07/24/20               ?Expected End Date:  ongoing                   ?  ?Follow Up Date--08/20/21 ? ?- check out counseling ?- keep 90 percent of counseling appointments ?- schedule counseling appointment  ?  ?Why is  this important?   ?          Beating depression may take some time.  ?          If you don't feel better right away, don't give up on your treatment plan.  ? ? Current barriers:   ?Chronic Mental Health needs related to stress and anxiety. Patient requires Support, Education, Resources, Referrals, Advocacy, and Care Coordination, in order to meet Unmet Mental Health Needs. ?Patient will implement clinical interventions discussed today to decrease symptoms of anxiety and increase knowledge and/or ability of: coping skills. ?Mental Health Concerns and Social Isolation ?Patient lacks knowledge of available community counseling agencies and resources. ? ?Clinical Goal(s): verbalize understanding of plan for management of Anxiety, and Stress and demonstrate a reduction in symptoms. Patient will connect with a provider for ongoing mental health treatment, increase coping skills, healthy habits, self-management skills, and stress reduction     ?   ?Patient Goals/Self-Care Activities: Over the next 120 days ?Attend scheduled medical appointments ?Utilize healthy coping skills and supportive resources discussed ?Contact PCP with any questions or concerns ?Keep 90 percent of counseling appointments ?Call your insurance provider for more information about your Enhanced Benefits  ?Check out counseling resources provided  ?Begin personal counseling with LCSW, to reduce and manage symptoms of Anxiety and Stress, until well-established with mental health provider ?Incorporate into daily practice - relaxation techniques,  deep breathing exercises, and mindfulness meditation strategies. ?Talk about feelings with friends, family members, spiritual advisor, etc. ?Contact LCSW directly [phone]), if you have questions, need assistance, or if additional social work needs are identified between now and our next scheduled telephone outreach call. ?Call 988 for mental health hotline/crisis line if needed (24/7 available) ?Try  techniques to reduce symptoms of anxiety/negative thinking (deep breathing, distraction, positive self talk, etc)  ?- develop a personal safety plan ?- develop a plan to deal with triggers like holidays, anniversaries ?- exercise at least 2 to 3 times per week ?- have a plan for how to handle bad days ?- journal feelings and what helps to feel better or worse ?- spend time or talk with others at least 2 to 3 times per week ?- watch for early signs of feeling worse ?- begin personal counseling ?- call and visit an old friend ?- check out volunteer opportunities ?- join a support group ?- laugh; watch a funny movie or comedian ?- learn and use visualization or guided imagery ?- perform a random act of kindness ?- practice relaxation or meditation daily ?- start or continue a personal journal ?- practice positive thinking and self-talk ?-continue with compliance of taking medication  ? ?Eula Fried, BSW, MSW, LCSW ?Managed Medicaid LCSW ?[facility] ?[email or web address] ?Phone: [phone] ? ? ? ?  ?  ?

## 2021-07-31 DIAGNOSIS — G8929 Other chronic pain: Secondary | ICD-10-CM | POA: Insufficient documentation

## 2021-08-20 ENCOUNTER — Other Ambulatory Visit: Payer: Self-pay

## 2021-08-20 NOTE — Patient Instructions (Signed)
Michele [name] ,  ? ?The [facility] Managed Care Team is available to provide assistance to you with your healthcare needs at no cost and as a benefit of your [facility] Health plan. I'm sorry I was unable to reach you today for our scheduled appointment. Our care guide will call you to reschedule our telephone appointment. Please call me at the number below. I am available to be of assistance to you regarding your healthcare needs. .  ? ?Thank you,  ? ?Eula Fried, BSW, MSW, LCSW ?Managed Medicaid LCSW ?[facility] ?[email or web address] ?Phone: [phone] ? ? ?

## 2021-08-20 NOTE — Patient Outreach (Signed)
[facility]) Care Management ? ?08/20/2021 ? ?Ayme Craton ?1990/10/09 ?[id number] ? ?LCSW completed [facility] outreach attempt today during scheduled appointment time but was unable to reach patient successfully. A HIPPA compliant voice message was left encouraging patient to return call once available. LCSW will ask Scheduling Care Guide to reschedule [facility] appointment with patient as well. ? ?Eula Fried, BSW, MSW, LCSW ?Managed Medicaid LCSW ?[facility] ?[email or web address] ?Phone: [phone] ? ? ? ?

## 2021-08-21 IMAGING — CT CT CHEST W/O CM
2 of 4 series · 12 of 36 positions shown, 15 images · non-contrast
Comparison: Chest x-ray 07/22/2019. CT abdomen pelvis 09/08/2017

CLINICAL DATA: Persistent cough. History of COVID infection in
Monday July, 2019.

EXAM:
CT CHEST WITHOUT CONTRAST
TECHNIQUE: Multidetector CT imaging of the chest was performed following the
standard protocol without IV contrast.

[Series 2: chest 2.00 br40 s3 · axial · 0.56mm/px · z∈[-943,-673]mm · 9 of 161 slices shown, 12 images (1 of 2)]
[im 13/161  mediastinal]
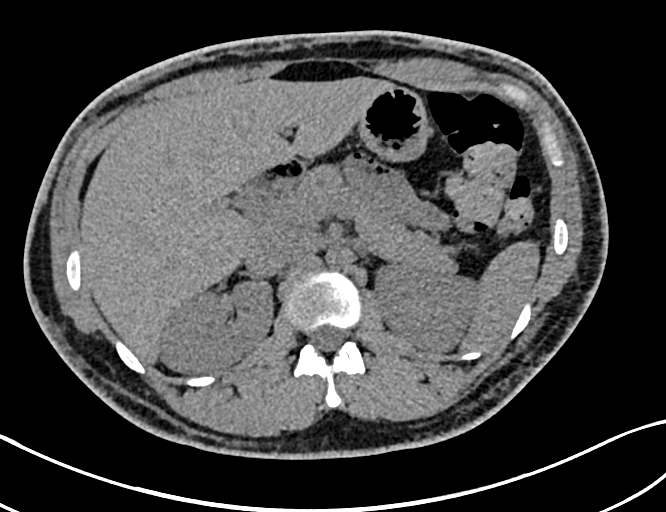
[im 13/161  lung]
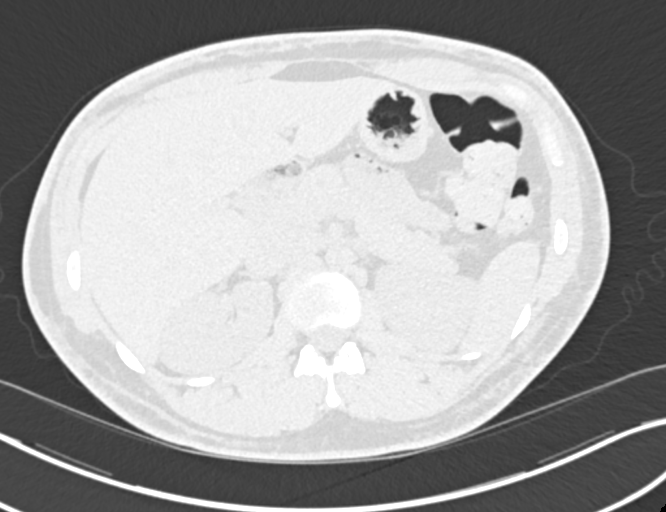
[im 37/161  lung]
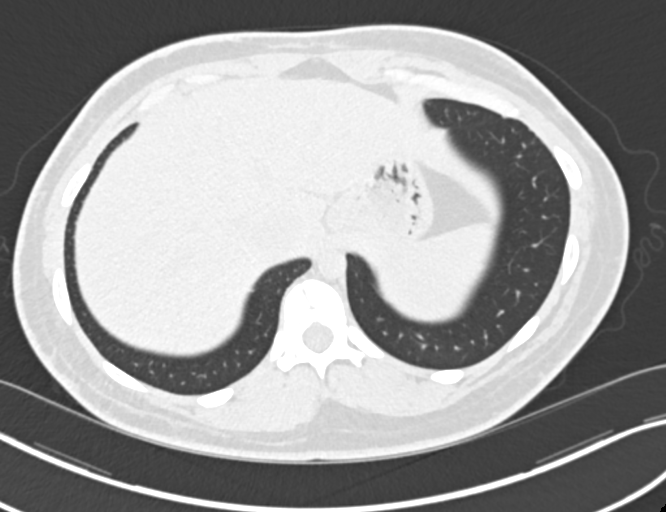
[im 50/161  lung]
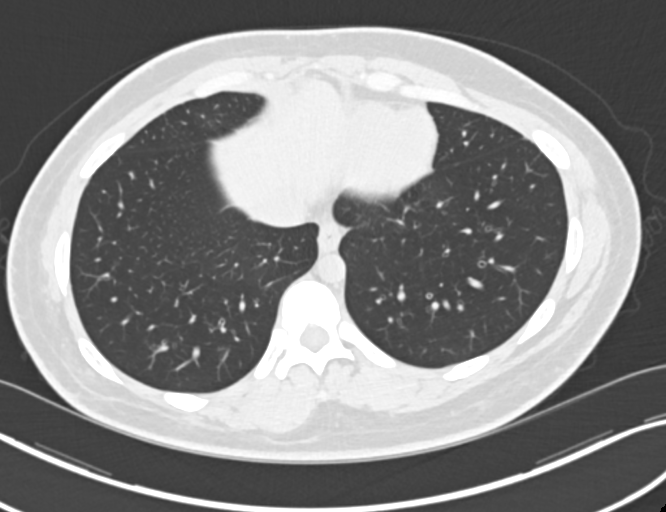
[im 62/161  lung]
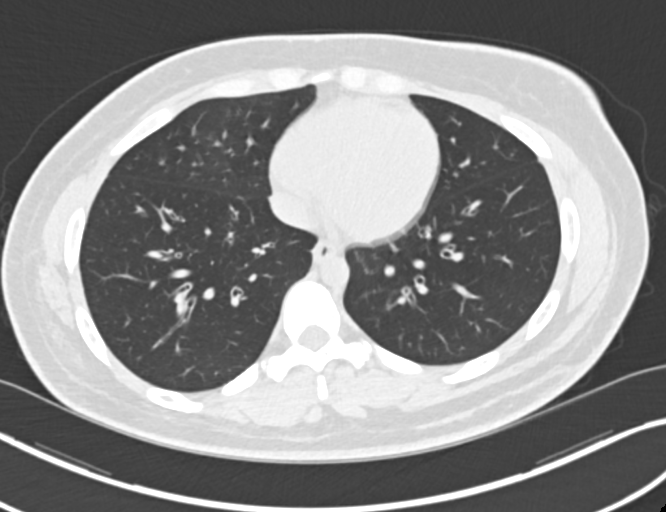
[im 87/161  mediastinal]
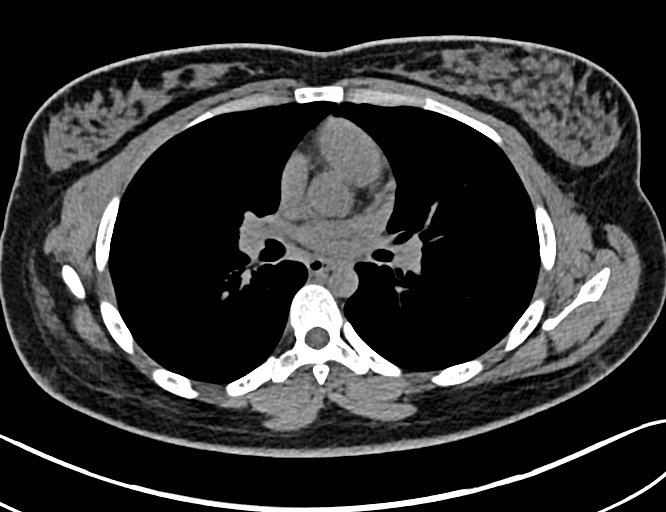
[im 87/161  lung]
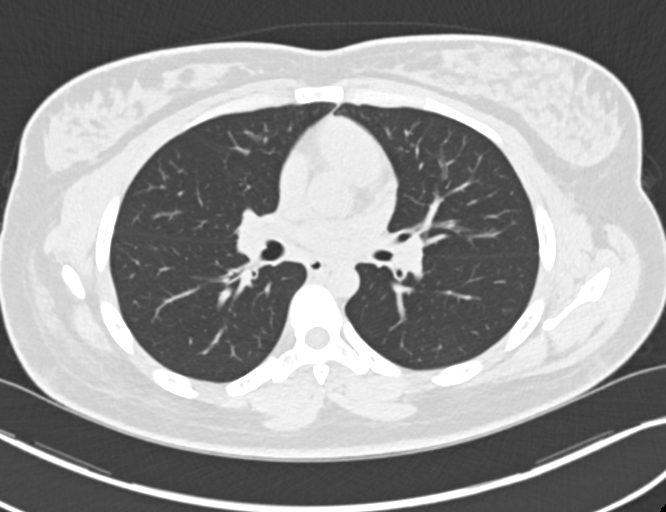
[im 99/161  lung]
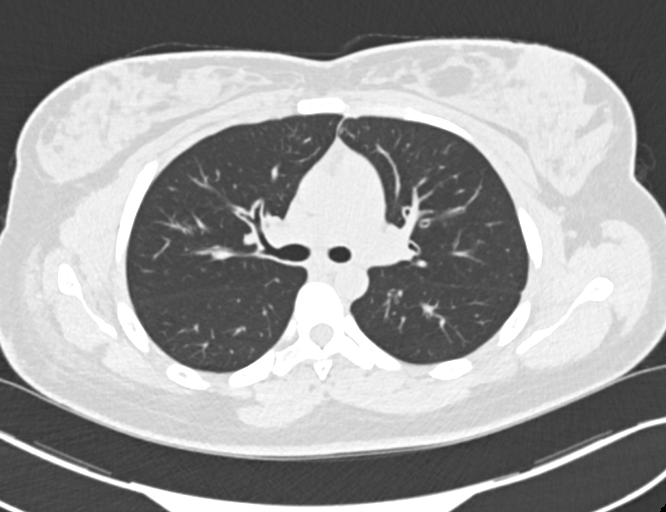
[im 111/161  lung]
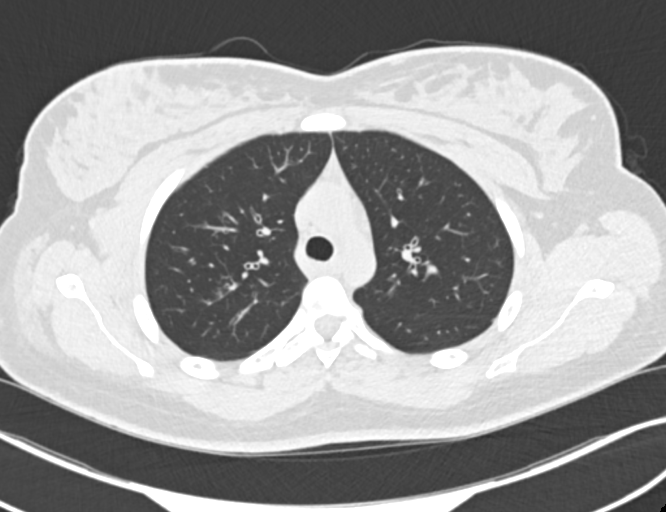
[im 136/161  lung]
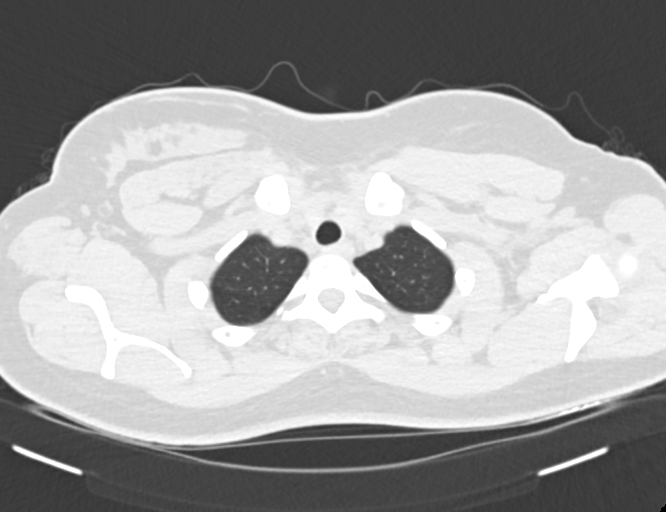
[im 148/161  mediastinal]
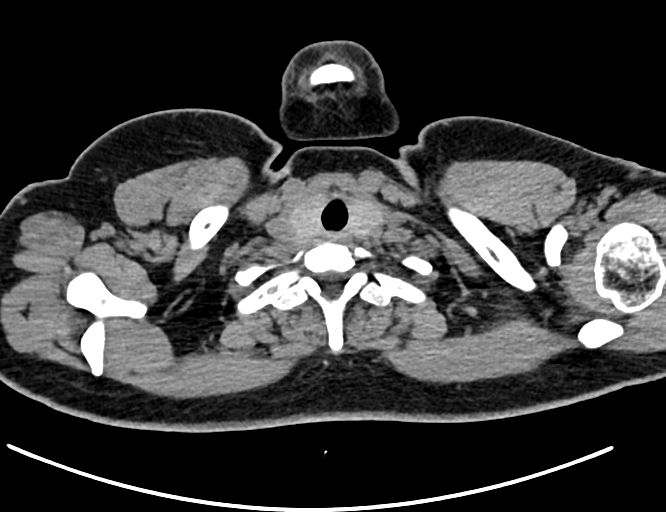
[im 148/161  lung]
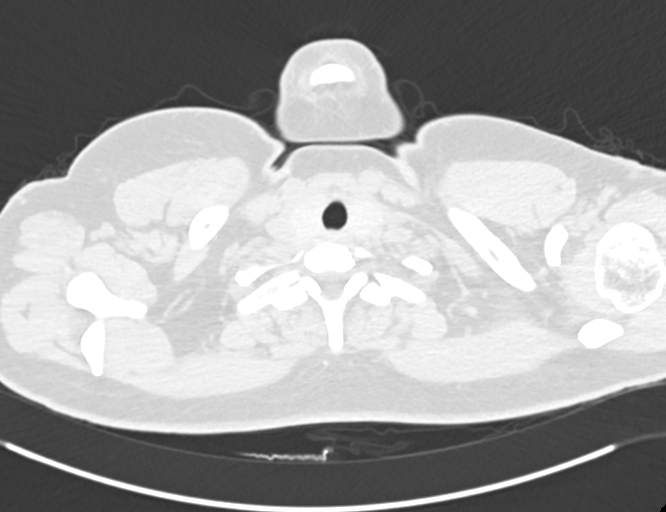

[Series 4: chest 2.00 br40 s3 · coronal · 0.63mm/px · 3 of 141 slices shown (2 of 2)]
[im 29/141  lung]
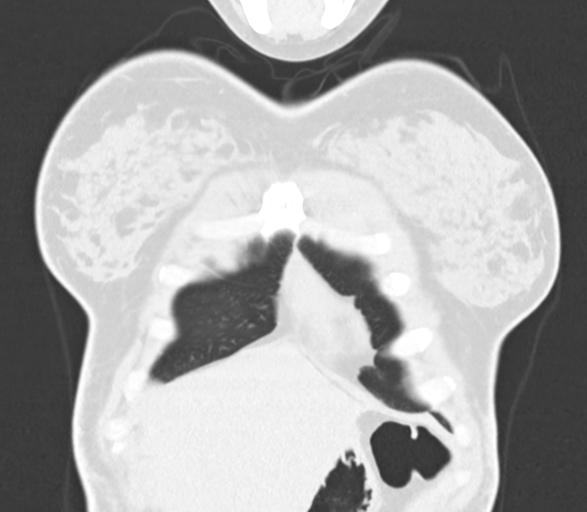
[im 57/141  lung]
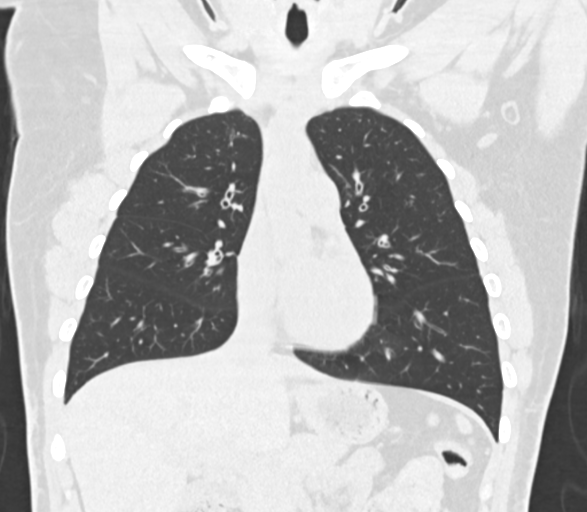
[im 85/141  lung]
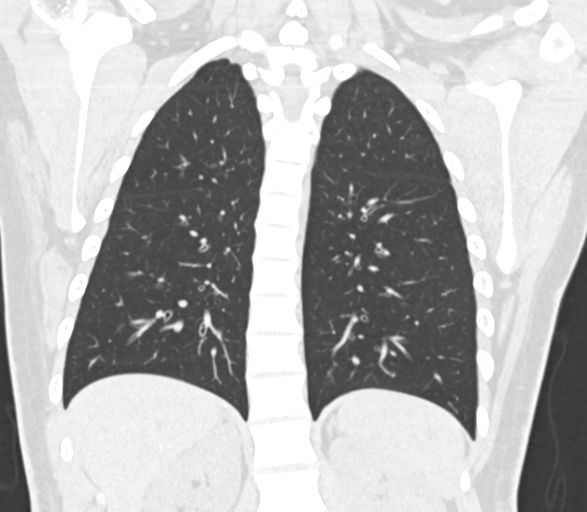

[12 of 36 positions shown; findings below may reference images not displayed]

FINDINGS: Cardiovascular: Normal heart size. No pericardial effusion. Thoracic
aorta is normal in course and caliber. Main pulmonary trunk is
nondilated.

Mediastinum/Nodes: No enlarged mediastinal or axillary lymph nodes.
Thyroid gland, trachea, and esophagus demonstrate no significant
findings. Surgical clips at the base of the neck posterior to the
right thyroid lobe.

Lungs/Pleura: Central bronchial wall thickening is noted throughout
both lungs. There are a few scattered areas of subtle ground-glass
and tree-in-bud nodularity within the peripheral aspect of the lung
fields (for example: series 8, images 47, 53, and 114), likely
residua of recent viral infection. No lobar airspace consolidation.
No pleural effusion. No pneumothorax.

Upper Abdomen: No acute findings. Unchanged rounded 1.3 cm
low-density lesion within the spleen is unchanged from 7195, and
likely reflects a cyst.

Musculoskeletal: No chest wall mass or suspicious bone lesions
identified.
IMPRESSION: 1. Central bronchial wall thickening is noted throughout both lungs,
suggestive of acute or chronic bronchitis.
2. There are a few scattered areas of subtle ground-glass and
tree-in-bud nodularity within the peripheral aspect of the lung
fields, likely residua of recent viral infection. No lobar airspace
consolidation.

## 2021-08-21 IMAGING — CT CT MAXILLOFACIAL W/O CM
3 of 5 series · 14 of 47 positions shown, 16 images · non-contrast
Comparison: None.

CLINICAL DATA: Chronic cough since June 2019. 2791X-OG infection
diagnosed in July 2019.

EXAM:
CT MAXILLOFACIAL WITHOUT CONTRAST
TECHNIQUE: Multidetector CT imaging of the maxillofacial structures was
performed. Multiplanar CT image reconstructions were also generated.

[Series 2: sinus 2.00 hr60 s3 axial · axial · 0.31mm/px · z∈[-592,-494]mm · 8 of 63 slices shown, 10 images]
[im 7/63  brain]
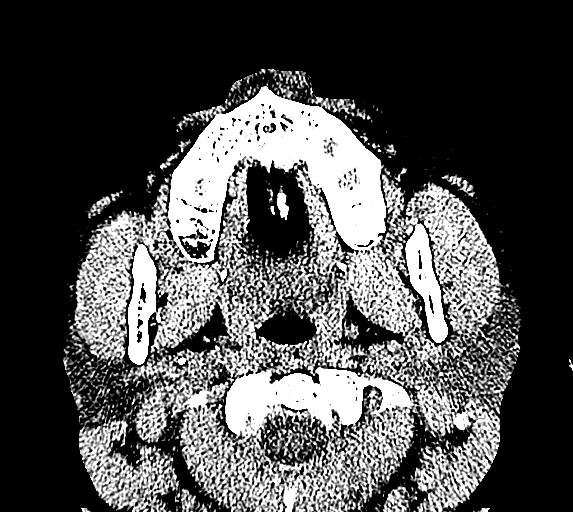
[im 7/63  bone]
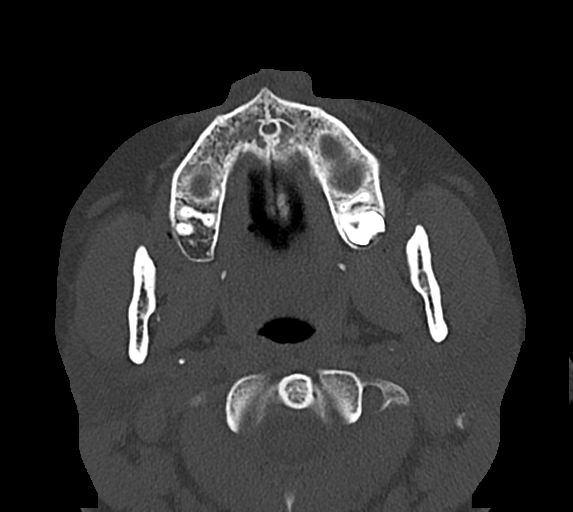
[im 14/63  bone]
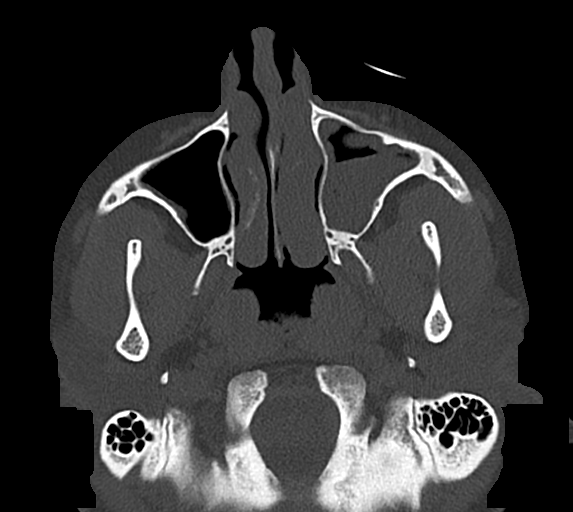
[im 21/63  bone]
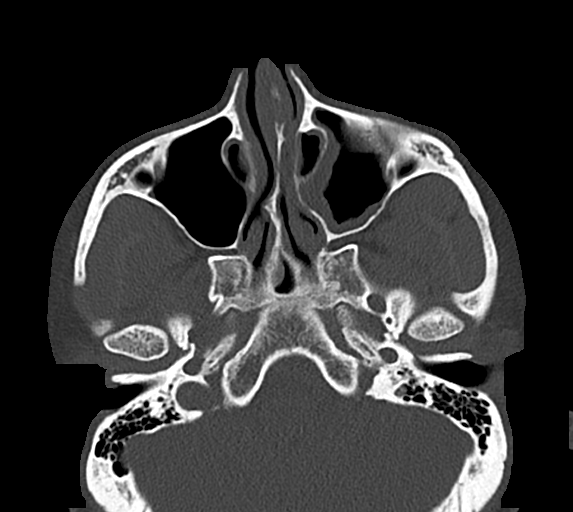
[im 28/63  bone]
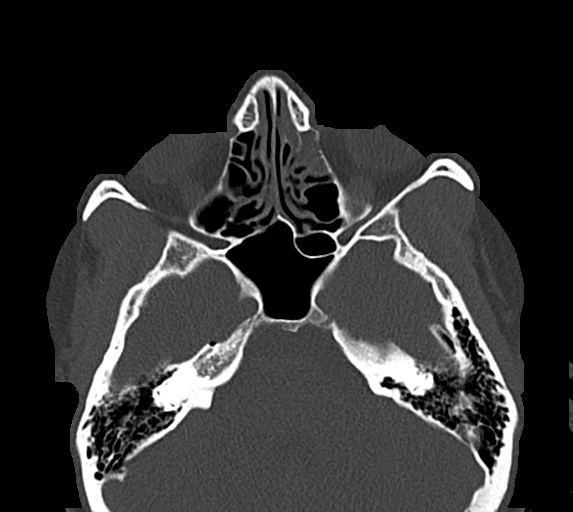
[im 35/63  brain]
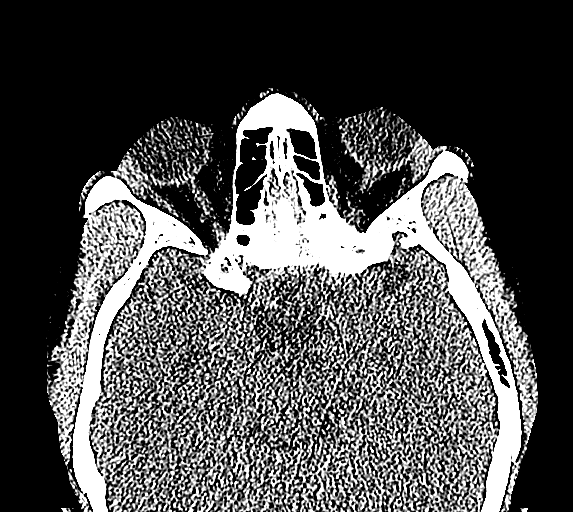
[im 35/63  bone]
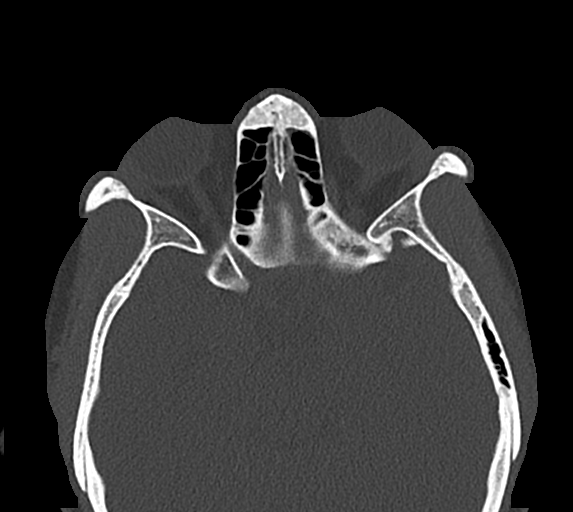
[im 42/63  bone]
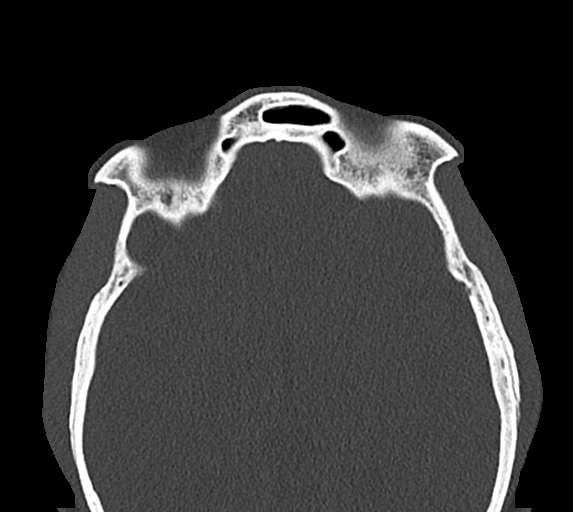
[im 49/63  bone]
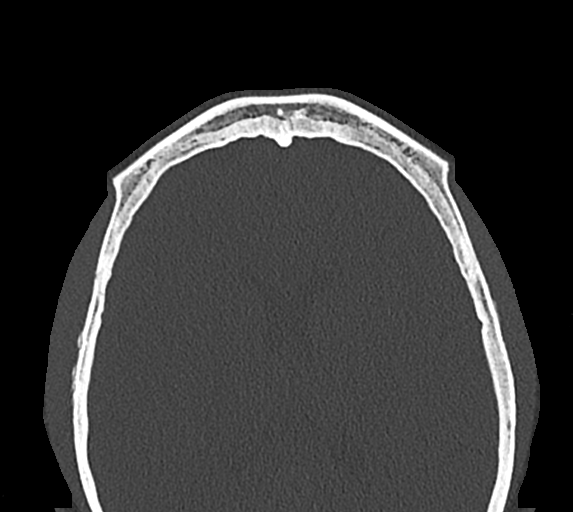
[im 56/63  bone]
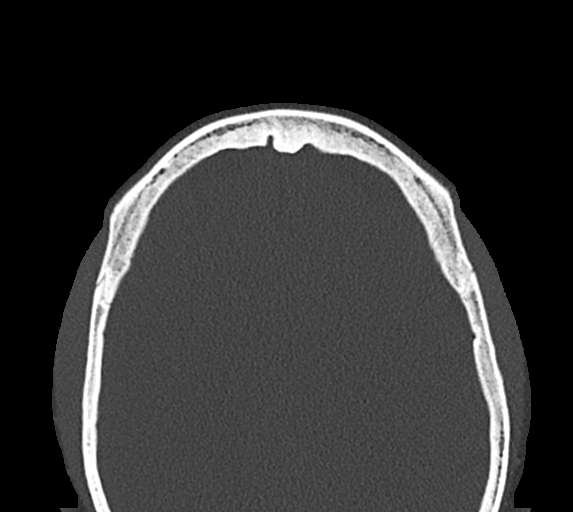

[Series 4: sinus 2.00 hr60 s3 cor · coronal · 0.25mm/px · 3 of 79 slices shown]
[im 27/79  bone]
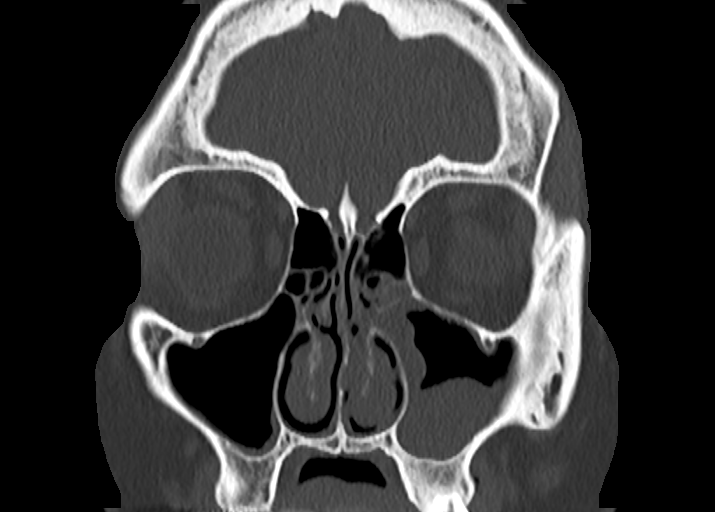
[im 35/79  bone]
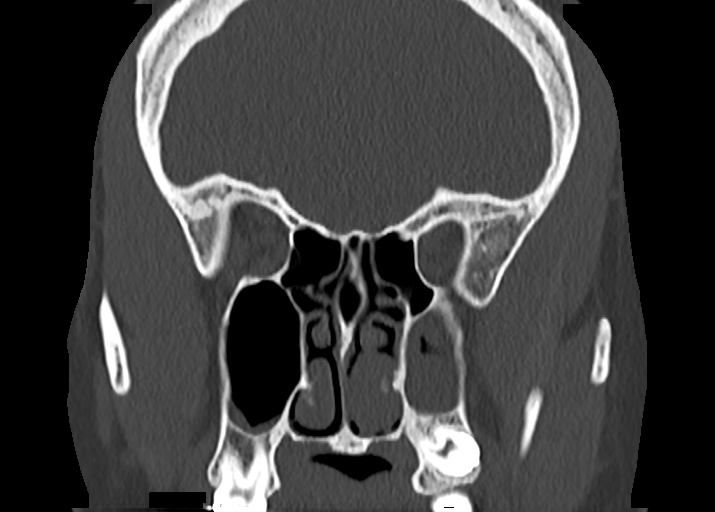
[im 44/79  bone]
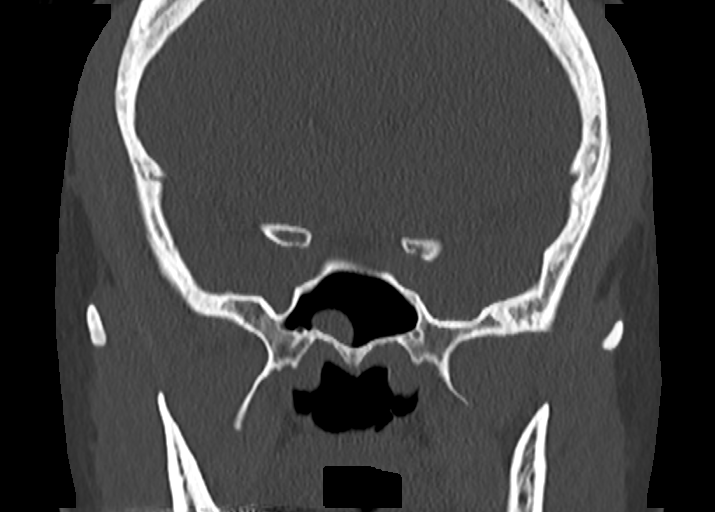

[Series 6: sinus 2.00 hr60 s3 sag · sagittal · 0.25mm/px · 3 of 88 slices shown]
[im 30/88  bone]
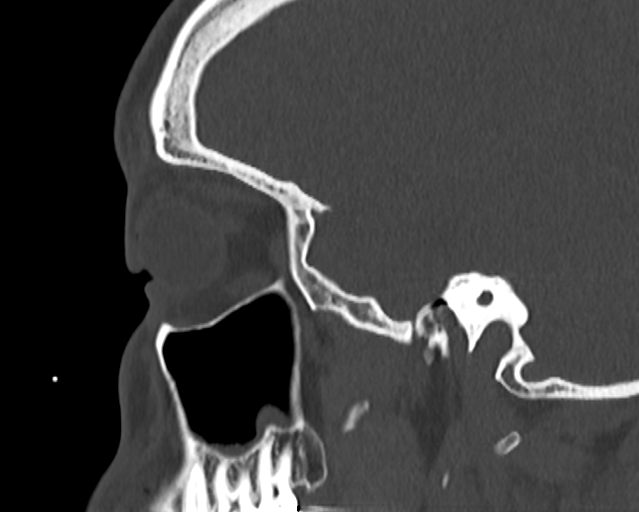
[im 44/88  bone]
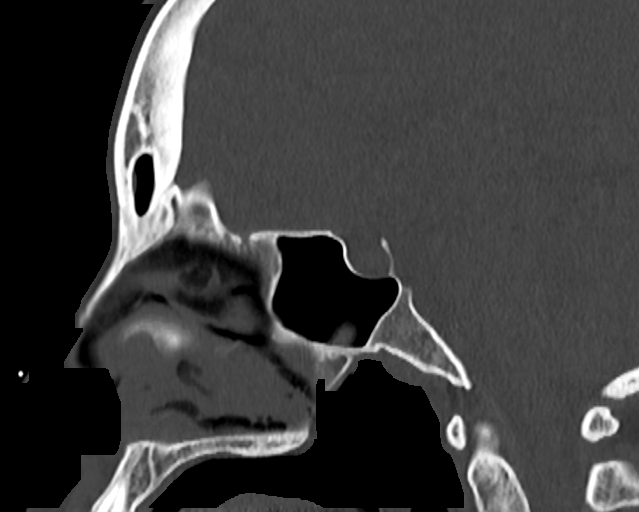
[im 59/88  bone]
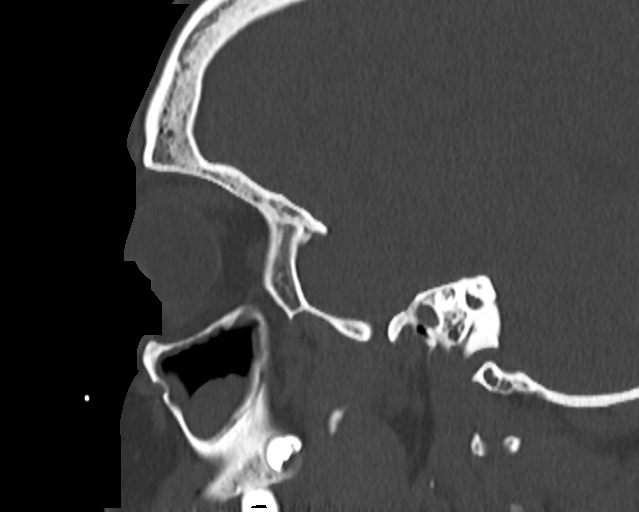

[14 of 47 positions shown; findings below may reference images not displayed]

FINDINGS: Osseous: Unremarkable bones.

Orbits: Normal appearing orbital contents.

Sinuses: Moderate left and mild right maxillary sinus mucosal
thickening. Probable retention cysts in the left maxillary sinus.
Left ethmoid sinus mucosal thickening anteriorly. Sphenoid sinus
retention cyst. No paranasal sinus air-fluid levels.

Soft tissues: Unremarkable.

Limited intracranial: No significant or unexpected finding.
IMPRESSION: Chronic bilateral maxillary and left ethmoid sinusitis. No acute
abnormality.

## 2021-09-10 ENCOUNTER — Encounter (HOSPITAL_COMMUNITY): Payer: Medicaid Other

## 2021-09-10 ENCOUNTER — Ambulatory Visit (HOSPITAL_COMMUNITY): Payer: Medicaid Other

## 2021-09-11 ENCOUNTER — Encounter (HOSPITAL_COMMUNITY): Payer: Medicaid Other

## 2021-09-24 ENCOUNTER — Encounter (HOSPITAL_COMMUNITY)
Admission: RE | Admit: 2021-09-24 | Discharge: 2021-09-24 | Disposition: A | Discharge status: Home / Self Care | Payer: 59 | Source: Ambulatory Visit | Attending: Internal Medicine | Admitting: Internal Medicine

## 2021-09-24 DIAGNOSIS — E0590 Thyrotoxicosis, unspecified without thyrotoxic crisis or storm: Secondary | ICD-10-CM | POA: Insufficient documentation

## 2021-09-24 MED ORDER — SODIUM IODIDE I-123 7.4 MBQ CAPS: 404.0000 | ORAL_CAPSULE | Freq: Once | ORAL | Status: DC

## 2021-09-25 ENCOUNTER — Encounter (HOSPITAL_COMMUNITY)
Admission: RE | Admit: 2021-09-25 | Discharge: 2021-09-25 | Disposition: A | Discharge status: Home / Self Care | Payer: 59 | Source: Ambulatory Visit | Attending: Internal Medicine | Admitting: Internal Medicine

## 2021-09-27 ENCOUNTER — Telehealth: Payer: Self-pay | Admitting: Internal Medicine

## 2021-09-27 NOTE — Telephone Encounter (Signed)
Called the pt on 09/27/2021 at 1315 to discuss  normal uptake and scan but the call went to voice mail but the voice mail was full   A portal message would be sent    Abby Nena Jordan, MD  [facility] Endocrinology  [facility] [address] [place], [place] [address] Phone: [phone] FAX: [phone]

## 2021-11-21 NOTE — Telephone Encounter (Signed)
Closing encounter

## 2021-12-31 ENCOUNTER — Ambulatory Visit: Payer: Medicaid Other | Admitting: Internal Medicine

## 2021-12-31 ENCOUNTER — Encounter: Payer: Self-pay | Admitting: Internal Medicine

## 2021-12-31 NOTE — Progress Notes (Deleted)
Name: Michele [name]  MRN/ DOB: [id number], 1990-06-20    Age/ Sex: 31 y.o., female    PCP: Steele Sizer, MD   Reason for Endocrinology Evaluation: Subclinical hyperthyroidism     Date of Initial Endocrinology Evaluation: 07/05/2020    HPI: Michele [name] is a 31 y.o. female with a past medical history of PSVT. The patient presented for initial endocrinology clinic visit on 07/05/2020 for consultative assistance with her subclinical hypertyhyroidism   She has been noted with intermittent low TSH since 2020 with a nadir of 0.20 uIU/mL  In 05/2020 but with normal FT4.   Was diagnosed with SVT's since high school, last time she had issues with it was during pregnancy in 2014. Was on Troprol at some point.   Has two kids born in 2013 and 2014  She was started on Methimazole 06/2020 with a TSH 0.25 uIU/mL due to hx of SVT, she was lost to follow up for ~ 1  yr until her return 06/2021   Unknown thyroid history in the family    Pt with hx of neutropenia prior to starting methimazole , neutropenia dates back to 2021 .    Thyroid uptake and scan showed a normal 24-hour I-123 uptake = 13.9% (normal 10-30%) on 09/26/2021  SUBJECTIVE:     Today (12/31/21):  Michele [name] is here for a follow up on subclinical hyperthyroidism .   She thought she could manage her thyroid with lifestyle changes but this did not help    She is tired , has mood swings  Denies recent palpitations  Has occasional tremors  Weight tends to fluctuate  She has an IUD She has noted spotting   Pt has a neck ?cyst in 2001 , requiring sx followed by scarring and requiring another sx 2011 . Surgery was complicated by right eye ptosis       HISTORY:  Past Medical History: No past medical history on file. Past Surgical History:  Past Surgical History:  Procedure Laterality Date   CERVICAL LAMINECTOMY  2001   CERVICAL SPINE SURGERY  2011   scar tissue removal   LIPOSUCTION AUOLOGOUS FAT TRANSFER TO  BUTTOCKS  03/25/2019   in [place], Dr. Lynann Bologna     Social History:  reports that she has never smoked. She has never used smokeless tobacco. She reports that she does not drink alcohol and does not use drugs. Family History: family history includes Allergic rhinitis in her mother and sister; Asthma in her sister; Dementia in her paternal grandmother; Hypertension in her father and mother.   HOME MEDICATIONS: Allergies as of 12/31/2021       Reactions   Codeine         Medication List        Accurate as of December 31, 2021  7:08 AM. If you have any questions, ask your nurse or doctor.          methimazole 5 MG tablet Commonly known as: TAPAZOLE Take 1 tablet (5 mg total) by mouth daily.          REVIEW OF SYSTEMS: A comprehensive ROS was conducted with the patient and is negative except as per HPI     OBJECTIVE:  VS: There were no vitals taken for this visit.   Wt Readings from Last 3 Encounters:  06/29/21 162 lb (73.5 kg)  06/27/21 163 lb (73.9 kg)  07/05/20 163 lb (73.9 kg)     EXAM: General: Pt appears well and is in NAD  Neck: General: Supple without adenopathy. Thyroid: Thyroid size normal.  No goiter or nodules appreciated. No thyroid bruit.  Lungs: Clear with good BS bilat with no rales, rhonchi, or wheezes  Heart: Auscultation: RRR.  Abdomen: Normoactive bowel sounds, soft, nontender, without masses or organomegaly palpable  Extremities:  BL LE: No pretibial edema normal ROM and strength.  Skin: Hair: Texture and amount normal with gender appropriate distribution Skin Inspection: No rashes Skin Palpation: Skin temperature, texture, and thickness normal to palpation  Neuro: Cranial nerves: II - XII grossly intact  Motor: Normal strength throughout DTRs: 2+ and symmetric in UE without delay in relaxation phase  Mental Status: Judgment, insight: Intact Orientation: Oriented to time, place, and person Mood and affect: No depression, anxiety, or  agitation     DATA REVIEWED:   Latest Reference Range & Units 06/27/21 10:24  WBC 3.8 - 10.8 Thousand/uL 4.2  RBC 3.80 - 5.10 Million/uL 4.88  Hemoglobin 11.7 - 15.5 g/dL 14.4  HCT 35.0 - 45.0 % 43.6  MCV 80.0 - 100.0 fL 89.3  MCH 27.0 - 33.0 pg 29.5  MCHC 32.0 - 36.0 g/dL 33.0  RDW 11.0 - 15.0 % 12.2  Platelets 140 - 400 Thousand/uL 264  MPV 7.5 - 12.5 fL 10.2  Neutrophils % 32.9  Monocytes Relative % 7.9  Eosinophil % 8.4  Basophil % 1.0  NEUT# 1,500 - 7,800 cells/uL 1,382 (L)  Lymphocyte # 850 - 3,900 cells/uL 2,092  Total Lymphocyte % 49.8  Eosinophils Absolute 15 - 500 cells/uL 353  Basophils Absolute 0 - 200 cells/uL 42  Absolute Monocytes 200 - 950 cells/uL 332     Thyroid uptake and scan 09/26/2021  FINDINGS: Homogeneous tracer distribution in both thyroid lobes.   No focal areas of increased or decreased tracer uptake.   4 hour I-123 uptake = 7.4% (normal 5-20%)   24 hour I-123 uptake = 13.9% (normal 10-30%)   IMPRESSION: Normal exam.    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) - TRAb undetectable  -We initially recommended methimazole due to her history of SVT, I have advised the patient that it is best to proceed with thyroid uptake and scan at this time -Patient would like this to be done at work as per regional    F/U in 6 months     Signed electronically by: Mack Guise, MD  [facility]  [address] [place], [place] [address] Phone: [phone] FAX: [phone]   CC: Steele Sizer, [address] [place] [address] Phone: [phone] Fax:  [phone]   Return to Endocrinology clinic as below: Future Appointments  Date Time Provider [place]  12/31/2021  9:10 AM Sharonna Vinje, Melanie Crazier, MD [facility] None  01/16/2022 10:40 AM Steele Sizer, MD [facility]

## 2022-01-15 NOTE — Progress Notes (Deleted)
Name: Michele [name]   MRN: [id number]    DOB: 06-15-1990   Date:01/15/2022       Progress Note  Subjective  Chief Complaint  Follow Up  HPI  Snoring: she states she noticed memory changes - difficulty remembering things and now more difficulty when studying. She states initially she blamed the fact that she goes to school full time and has children, but symptoms are getting worse , she used to do this as a single mother without problems. She snores at night, wakes up feeling tired. She goes to bed around 10 pm and wakes up around 5:30 am . She has her tonsils and adenoids. We placed a referral for sleepy study back in September but she did not get a phone call, we gave her their number last time but she has not called them yet, she is feeling overwhelmed at this time   Shaking feeling: usually happens after she eats, about one hours later, she develops headaches and shakes, discussed high protein diet and avoid carbs. Her last glucose non fasting was down to 64, explained likely symptoms from hypoglycemia She states since she cut down on carbs she no longer having shakes episodes. Glucose at home has been above 80. 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 She was recently seen at [facility] in [place] [employer] and had MRI that showed bulging disc but report not available for review. She had not received injections yet or seen neurosurgeon. Muscle relaxer caused sedation and affects her ability to do her school work  We gave her Lyrica  BID, it makes her feel sleepy, we will increase just the pm dose.  Today pain level is high at 8/10 . Discussed about mindfulness   Dysthymia/Anxiety: she is overwhelmed, taking 18 credit hours at school, also has two children, planning her wedding July 2022, feels anxious and nervous all the time, difficulty focusing, and lately sleeping too much, feels  like she cannot do anything. We discussed therapy and started her on Duloxetine, she states some days are good and some not so good  Low TSH:  Back in Nov we will recheck it today,  discussed sub-clinical hypothyroidism with patient   Patient Active Problem List   Diagnosis Date Noted   Hyperthyroidism, subclinical 06/27/2021   Acquired ptosis of right eyelid 06/27/2021   Cervical post-laminectomy syndrome 05/01/2020   PSVT (paroxysmal supraventricular tachycardia) 01/18/2019   Memory changes 04/24/2018   Left lumbar radiculitis 04/24/2018    Past Surgical History:  Procedure Laterality Date   CERVICAL LAMINECTOMY  2001   CERVICAL SPINE SURGERY  2011   scar tissue removal   LIPOSUCTION AUOLOGOUS FAT TRANSFER TO BUTTOCKS  03/25/2019   in [place], Dr. Lynann Bologna     Family History  Problem Relation Age of Onset   Hypertension Mother    Allergic rhinitis Mother    Hypertension Father    Asthma Sister    Allergic rhinitis Sister    Dementia Paternal Grandmother    Eczema Neg Hx    Urticaria Neg Hx     Social History   Tobacco Use   Smoking status: Never   Smokeless tobacco: Never  Substance Use Topics   Alcohol use: Never     Current Outpatient Medications:    methimazole (TAPAZOLE) 5 MG tablet, Take 1 tablet (5 mg total) by mouth daily. (Patient  not taking: Reported on 06/27/2021), Disp: 30 tablet, Rfl: 6  Allergies  Allergen Reactions   Codeine     I personally reviewed active problem list, medication list, allergies, family history, social history, health maintenance with the patient/caregiver today.   ROS  ***  Objective  There were no vitals filed for this visit.  There is no height or weight on file to calculate BMI.  Physical Exam ***  No results found for this or any previous visit (from the past 2160 hour(s)).   PHQ2/9:    07/24/2021    9:43 AM 07/17/2021    8:01 AM 06/27/2021    9:38 AM 06/01/2021    7:51 AM 05/24/2020    8:27 AM  Depression  screen PHQ 2/9  Decreased Interest 0 0 1 0 1  Down, Depressed, Hopeless 0 0 1 0 1  PHQ - 2 Score 0 0 2 0 2  Altered sleeping 0 0 0 0 0  Tired, decreased energy 0 0 '3 3 1  '$ Change in appetite 1 0 0 0 1  Feeling bad or failure about yourself  0 0 0 0 0  Trouble concentrating 0 0 3 0 2  Moving slowly or fidgety/restless 0 0 0 0 0  Suicidal thoughts 0 0 0 0 0  PHQ-9 Score 1 0 '8 3 6  '$ Difficult doing work/chores Somewhat difficult Not difficult at all  Not difficult at all Somewhat difficult    phq 9 is {gen pos [id number]}   Fall Risk:    07/17/2021    8:01 AM 06/27/2021    9:38 AM 06/01/2021    7:51 AM 05/24/2020    8:27 AM 05/10/2020    8:08 AM  Fall Risk   Falls in the past year? 0 0 0 0 0  Number falls in past yr: 0 0 0 0 0  Injury with Fall? 0 0 0 0 0  Risk for fall due to :  No Fall Risks     Follow up Falls prevention discussed Falls prevention discussed         Functional Status Survey:      Assessment & Plan  *** There are no diagnoses linked to this encounter.

## 2022-01-16 ENCOUNTER — Ambulatory Visit: Payer: Medicaid Other | Admitting: Family Medicine

## 2023-06-12 ENCOUNTER — Emergency Department (HOSPITAL_COMMUNITY)
Admission: EM | Admit: 2023-06-12 | Discharge: 2023-06-12 | Disposition: A | Discharge status: Home / Self Care | Payer: Self-pay | Attending: Emergency Medicine | Admitting: Emergency Medicine

## 2023-06-12 ENCOUNTER — Other Ambulatory Visit: Payer: Self-pay

## 2023-06-12 ENCOUNTER — Emergency Department (HOSPITAL_COMMUNITY): Payer: Self-pay

## 2023-06-12 DIAGNOSIS — M5417 Radiculopathy, lumbosacral region: Secondary | ICD-10-CM

## 2023-06-12 DIAGNOSIS — M549 Dorsalgia, unspecified: Secondary | ICD-10-CM | POA: Diagnosis present

## 2023-06-12 DIAGNOSIS — M5431 Sciatica, right side: Secondary | ICD-10-CM | POA: Insufficient documentation

## 2023-06-12 DIAGNOSIS — M5432 Sciatica, left side: Secondary | ICD-10-CM | POA: Diagnosis not present

## 2023-06-12 LAB — URINALYSIS, ROUTINE W REFLEX MICROSCOPIC
Bilirubin Urine: NEGATIVE
Glucose, UA: NEGATIVE mg/dL
Hgb urine dipstick: NEGATIVE
Ketones, ur: NEGATIVE mg/dL
Leukocytes,Ua: NEGATIVE
Nitrite: NEGATIVE
Protein, ur: NEGATIVE mg/dL
Specific Gravity, Urine: 1.023 (ref 1.005–1.030)
pH: 5 (ref 5.0–8.0)

## 2023-06-12 LAB — PREGNANCY, URINE: Preg Test, Ur: NEGATIVE

## 2023-06-12 MED ORDER — PREDNISONE 20 MG PO TABS: 40.0000 mg | ORAL_TABLET | Freq: Every day | ORAL | 0 refills | Status: AC

## 2023-06-12 MED ORDER — IBUPROFEN 200 MG PO TABS
600.0000 mg | ORAL_TABLET | Freq: Once | ORAL | Status: AC
  Administered 2023-06-12: 600 mg via ORAL
  Filled 2023-06-12: qty 1

## 2023-06-12 MED ORDER — HYDROCODONE-ACETAMINOPHEN 5-325 MG PO TABS
1.0000 | ORAL_TABLET | Freq: Once | ORAL | Status: AC
  Administered 2023-06-12: 1 via ORAL
  Filled 2023-06-12: qty 1

## 2023-06-12 MED ORDER — GADOBUTROL 1 MMOL/ML IV SOLN
7.5000 mL | Freq: Once | INTRAVENOUS | Status: AC | PRN
  Administered 2023-06-12: 7.5 mL via INTRAVENOUS

## 2023-06-12 MED ORDER — IBUPROFEN 400 MG PO TABS: 400.0000 mg | ORAL_TABLET | Freq: Three times a day (TID) | ORAL | 0 refills | Status: AC

## 2023-06-12 MED ORDER — GADOBUTROL 1 MMOL/ML IV SOLN: 7.5000 mL | Freq: Once | INTRAVENOUS | Status: DC | PRN

## 2023-06-12 MED ORDER — IBUPROFEN 400 MG PO TABS
600.0000 mg | ORAL_TABLET | Freq: Once | ORAL | Status: AC
  Administered 2023-06-12: 600 mg via ORAL
  Filled 2023-06-12: qty 1

## 2023-06-12 MED ORDER — HYDROCODONE-ACETAMINOPHEN 5-325 MG PO TABS: 2.0000 | ORAL_TABLET | ORAL | 0 refills | Status: AC | PRN

## 2023-06-12 MED ORDER — PREDNISONE 20 MG PO TABS
60.0000 mg | ORAL_TABLET | Freq: Once | ORAL | Status: AC
  Administered 2023-06-12: 60 mg via ORAL
  Filled 2023-06-12: qty 3

## 2023-06-12 MED ORDER — CYCLOBENZAPRINE HCL 10 MG PO TABS
10.0000 mg | ORAL_TABLET | Freq: Once | ORAL | Status: AC
  Administered 2023-06-12: 10 mg via ORAL
  Filled 2023-06-12: qty 1

## 2023-06-12 NOTE — ED Triage Notes (Signed)
 Pt. Stated, I started having pain this morning with low back pain and right leg pain and toes on right foot is numb. 3 toes from pinky to the middle is numb.

## 2023-06-12 NOTE — ED Provider Notes (Signed)
 Care of the patient assumed at signout. 6:26 PM Patient awake, alert, speaking clearly, hemodynamically unremarkable.  I reviewed the patient's MRI, discussed with her and her husband.  With concern for lumbosacral radiculopathy, patient there is no evidence for cauda equina, will follow-up with her Ortho specialist.  Patient received additional meds here, will have same on discharge.   Gerhard Munch, MD 06/12/23 [phone]

## 2023-06-12 NOTE — Discharge Instructions (Signed)
 Please be sure to follow-up with your orthopedic specialist.  Below is the interpretation of today's MRI test.  Return here for concerning changes in your condition. Disc levels:   T12-L1 to L3-L4:  Negative.   L4-L5: Small central disc protrusion and annular fissure. Mild right greater than left lateral recess stenosis. No spinal canal or neuroforaminal stenosis.   L5-S1: Moderate disc bulging with superimposed left and right subarticular disc protrusions. Bilateral S1 nerve root impingement. Mild right and moderate left neuroforaminal stenosis. No spinal canal stenosis.

## 2023-06-12 NOTE — ED Provider Notes (Signed)
 [place] EMERGENCY DEPARTMENT AT [facility] Provider Note   CSN: [id number] Arrival date & time: 06/12/23  [id number]     History  Chief Complaint  Patient presents with   Back Pain   Leg Pain   toes numb    Michele [name] is a 33 y.o. female.  With a history of sciatic back pain and status post cervical laminectomy who presents for back pain.  Patient suffered from left sciatic back and left lower extremity pain for a number of years.  Today she was doing laundry and sneezed and immediately thereafter began to have right lower back pain and pain down her right lower extremity.  She has been able to walk.  No history of right sided sciatica in the past.  No urinary incontinence, fecal incontinence, urinary retention fevers chills history of drug use or lower back surgeries.   Back Pain Associated symptoms: leg pain   Leg Pain Associated symptoms: back pain        Home Medications Prior to Admission medications   Medication Sig Start Date End Date Taking? Authorizing Provider  ibuprofen (ADVIL) 200 MG tablet Take 200 mg by mouth every 6 (six) hours as needed for moderate pain (pain score 4-6).   Yes [provider]      Allergies    Codeine    Review of Systems   Review of Systems  Musculoskeletal:  Positive for back pain.    Physical Exam Updated Vital Signs BP 134/82   Pulse 65   Temp 97.8 F (36.6 C) (Oral)   Resp 16   SpO2 100%  Physical Exam Vitals and nursing note reviewed.  HENT:     Head: Normocephalic and atraumatic.  Eyes:     Pupils: Pupils are equal, round, and reactive to light.  Cardiovascular:     Rate and Rhythm: Normal rate and regular rhythm.  Pulmonary:     Effort: Pulmonary effort is normal.     Breath sounds: Normal breath sounds.  Abdominal:     Palpations: Abdomen is soft.     Tenderness: There is no abdominal tenderness.  Musculoskeletal:     Comments: Sensation tact to light touch throughout bilateral lower  extremities Limited strength testing in right lower extremity secondary to pain Tenderness over right lower back and along right lateral thigh 2+ DP pulse bilaterally  Skin:    General: Skin is warm and dry.  Neurological:     Mental Status: She is alert.  Psychiatric:        Mood and Affect: Mood normal.     ED Results / Procedures / Treatments   Labs (all labs ordered are listed, but only abnormal results are displayed) Labs Reviewed  URINALYSIS, ROUTINE W REFLEX MICROSCOPIC - Abnormal; Notable for the following components:      Result Value   APPearance HAZY (*)    All other components within normal limits  PREGNANCY, URINE    EKG None  Radiology CT Lumbar Spine Wo Contrast Result Date: 06/12/2023 CLINICAL DATA:  Lumbar radiculopathy, infection suspected. EXAM: CT LUMBAR SPINE WITHOUT CONTRAST TECHNIQUE: Multidetector CT imaging of the lumbar spine was performed without intravenous contrast administration. Multiplanar CT image reconstructions were also generated. RADIATION DOSE REDUCTION: This exam was performed according to the departmental dose-optimization program which includes automated exposure control, adjustment of the mA and/or kV according to patient size and/or use of iterative reconstruction technique. COMPARISON:  CT abdomen pelvis 09/08/2017 FINDINGS: Segmentation: 5 lumbar type vertebrae. Alignment:  There is mild straightening of the normal lumbar lordosis. Trace retrolisthesis of L5 on S1. Mild levocurvature of the lumbar spine centered at L2-3. Vertebrae: No compression fracture or displaced fracture in the lumbar spine. There is sclerosis along the L5-S1 endplates more pronounced along the left aspect which is new since 2019. Additional sclerosis anteriorly and centrally along the endplates noted. There is subtle irregularity of the L5 inferior endplate without evidence of erosive change. Paraspinal and other soft tissues: Negative. Disc levels: There is mild disc  space narrowing at L5-S1. Small disc bulge at L4-5 without significant spinal canal stenosis. Disc bulge and possible left paracentral/subarticular disc protrusion at L5-S1. Mild spinal canal stenosis. There is likely narrowing of the left lateral recess at this level. Disc bulge, posterior osteophytes, and mild facet degenerative changes on the left at L5-S1 resulting in moderate to severe foraminal narrowing. Disc bulge contributes to moderate foraminal narrowing on the right. IMPRESSION: 1. Endplate sclerosis and subtle irregularity at L5-S1 is likely related to degenerative changes. No definite evidence of infection on noncontrast CT. If continued clinical concern for infection, recommend contrast enhanced MRI. 2. Degenerative changes in the lower lumbar spine most pronounced at L5-S1. Left paracentral/subarticular disc protrusion likely results in narrowing of the left lateral recess and mild spinal canal stenosis. Consider MRI for further evaluation. 3. Moderate to severe left and moderate right foraminal narrowing at L5-S1. Electronically Signed   By: Emily Filbert M.D.   On: 06/12/2023 13:57    Procedures Procedures    Medications Ordered in ED Medications  gadobutrol (GADAVIST) 1 MMOL/ML injection 7.5 mL (has no administration in time range)  ibuprofen (ADVIL) tablet 600 mg (600 mg Oral Given 06/12/23 1156)  cyclobenzaprine (FLEXERIL) tablet 10 mg (10 mg Oral Given 06/12/23 1156)  gadobutrol (GADAVIST) 1 MMOL/ML injection 7.5 mL (7.5 mLs Intravenous Contrast Given 06/12/23 1505)    ED Course/ Medical Decision Making/ A&P Clinical Course as of 06/12/23 1552  Thu Jun 12, 2023  1411 Pregnancy test is negative.  UA negative.  CT lumbar without contrast reveals endplate sclerosis and subtle irregularity at L5-S1 probably related to degenerative changes.  As well as degenerative changes in the lower lumbar most pronounced at L5-S1 with narrowing at the left lateral recess and spinal canal stenosis.   Also moderate to severe left and moderate right foraminal narrowing at L5-S1.  Recommend MRI for further evaluation.  Will order MRI at this time. [MP]  1545 I, Estelle June DO, am transitioning care of this patient to the oncoming provider pending MRI lumbar spine reevaluation and disposition [MP]    Clinical Course User Index [MP] Royanne Foots, DO                                 Medical Decision Making 33 year old female with history as above presenting for acute onset right lower back pain and right lower extremity pain.  Pain began after sneezing.  She has a history of sciatic back pain for years but only on the left and never on the right.  No other red flag symptoms but has not had recent imaging.  Will obtain a pregnancy test first and then CT lumbar spine to look for any evidence of disc protrusion or worsening of chronic lumbar stenosis.  Will provide Flexeril and ibuprofen for analgesia  Amount and/or Complexity of Data Reviewed Labs: ordered. Radiology: ordered.  Risk Prescription drug management.  Final Clinical Impression(s) / ED Diagnoses Final diagnoses:  Bilateral sciatica    Rx / DC Orders ED Discharge Orders     None         Royanne Foots, DO 06/12/23 1552
